# Patient Record
Sex: Male | Born: 1950 | Race: Black or African American | Hispanic: No | Marital: Married | Smoking: Former smoker
Health system: Southern US, Community
[De-identification: ages and names within clinical notes are randomized; demographics above are authoritative.]

## PROBLEM LIST (undated history)

## (undated) DIAGNOSIS — R51 Headache: Secondary | ICD-10-CM

## (undated) DIAGNOSIS — G4700 Insomnia, unspecified: Secondary | ICD-10-CM

## (undated) DIAGNOSIS — M1990 Unspecified osteoarthritis, unspecified site: Secondary | ICD-10-CM

## (undated) DIAGNOSIS — J45909 Unspecified asthma, uncomplicated: Secondary | ICD-10-CM

## (undated) DIAGNOSIS — E119 Type 2 diabetes mellitus without complications: Secondary | ICD-10-CM

## (undated) DIAGNOSIS — Z860100 Personal history of colon polyps, unspecified: Secondary | ICD-10-CM

## (undated) DIAGNOSIS — R011 Cardiac murmur, unspecified: Secondary | ICD-10-CM

## (undated) DIAGNOSIS — J189 Pneumonia, unspecified organism: Secondary | ICD-10-CM

## (undated) DIAGNOSIS — G4730 Sleep apnea, unspecified: Secondary | ICD-10-CM

## (undated) DIAGNOSIS — F32A Depression, unspecified: Secondary | ICD-10-CM

## (undated) DIAGNOSIS — M2540 Effusion, unspecified joint: Secondary | ICD-10-CM

## (undated) DIAGNOSIS — M2550 Pain in unspecified joint: Secondary | ICD-10-CM

## (undated) DIAGNOSIS — I10 Essential (primary) hypertension: Secondary | ICD-10-CM

## (undated) DIAGNOSIS — H409 Unspecified glaucoma: Secondary | ICD-10-CM

## (undated) DIAGNOSIS — K219 Gastro-esophageal reflux disease without esophagitis: Secondary | ICD-10-CM

## (undated) DIAGNOSIS — E785 Hyperlipidemia, unspecified: Secondary | ICD-10-CM

## (undated) DIAGNOSIS — M549 Dorsalgia, unspecified: Secondary | ICD-10-CM

## (undated) DIAGNOSIS — R350 Frequency of micturition: Secondary | ICD-10-CM

## (undated) DIAGNOSIS — F329 Major depressive disorder, single episode, unspecified: Secondary | ICD-10-CM

## (undated) HISTORY — PX: WISDOM TOOTH EXTRACTION: SHX21

## (undated) HISTORY — PX: COLONOSCOPY: SHX174

---

## 1970-10-31 HISTORY — PX: EYE SURGERY: SHX253

## 2014-04-18 ENCOUNTER — Ambulatory Visit: Payer: Self-pay | Admitting: Podiatry

## 2014-05-14 ENCOUNTER — Ambulatory Visit (INDEPENDENT_AMBULATORY_CARE_PROVIDER_SITE_OTHER): Payer: PRIVATE HEALTH INSURANCE | Admitting: Podiatry

## 2014-05-14 ENCOUNTER — Encounter: Payer: Self-pay | Admitting: Podiatry

## 2014-05-14 VITALS — BP 141/84 | HR 70 | Ht 69.5 in | Wt 249.0 lb

## 2014-05-14 DIAGNOSIS — B351 Tinea unguium: Secondary | ICD-10-CM | POA: Diagnosis not present

## 2014-05-14 DIAGNOSIS — M79609 Pain in unspecified limb: Secondary | ICD-10-CM | POA: Diagnosis not present

## 2014-05-14 DIAGNOSIS — M79606 Pain in leg, unspecified: Secondary | ICD-10-CM | POA: Insufficient documentation

## 2014-05-14 DIAGNOSIS — M216X9 Other acquired deformities of unspecified foot: Secondary | ICD-10-CM | POA: Insufficient documentation

## 2014-05-14 NOTE — Progress Notes (Signed)
Subjective: 63 year old male presents complaining of pain in arch of both feet.  Stated that both feet have occasional sharp pain with tingling sensations at about instep arch area of both feet x years, even before he was diagnosed with diabetes.  Blood sugar this morning was 97. Been diabetic x 4-5 years.   Review of Systems - General ROS: positive for  - sleep disturbance, weight gain and Takes pills and use machine to deal with sleep apnea.  Ophthalmic ROS: Using medication for Glaucoma. ENT ROS: Gets regular check up every 6 months for bronchopneumonia.  Respiratory ROS: no cough, shortness of breath, or wheezing Cardiovascular ROS: no chest pain or dyspnea on exertion Gastrointestinal ROS: Takes Magnesicum and problem with stool. Genito-Urinary ROS: no dysuria, trouble voiding, or hematuria Musculoskeletal ROS: Bad joint on both knees x many years. Bad joint on right >L. Neurological ROS: no TIA or stroke symptoms Dermatological ROS: negative.  Objective: Dermatologic: Hypertrophic dystrophic nails x 10.  Tropical rash comes and goes. He got it while he was in [employer] service.  Vascular: All pedal pulses are palpable. No edema or erythema noted.  Neurologic: All epicritic and tactile sensations grossly intact. Normal response to Monofilament sensory testing bilateral.  Subjective pain at plantar mid arch area with prolonged weight bearing. Orthopedic: Pain at both knee joint R>L.  Tight Achilles tendon bilateral.  Compensatory abduction and Subtalar Joint hyperpronation. No gross deformities in osseous structures.    Assessment: 1. Plantar fasciitis mid strip bilateral with neurologic menifestation. 2. Compensatory STJ hyperpronation bilateral with Tarsal tunnel syndrome. . 3. Ankle equinus bilateral secondary development due to severe knee joint problem.   Plan: Reviewed findings and available treatment options. Instructed how to do Achilles tendon stretch exercise.   Instructed to wear custom made Orthotic shoe inserts.  All nails debrided. Patient may return to [facility] to have Orthotics prepared. May return in 10 weeks for routine foot care.

## 2014-05-14 NOTE — Patient Instructions (Signed)
Seen for pain in arch of both feet and toe nails. Noted of tight Achilles tendon causing excess rotation of foot and possible Tarsal tunnel like neurologic symptom. All nails debrided. May benefit from Achilles tendon stretch exercise and Custom orthotics.  Return in 10 weeks for routine foot care.

## 2014-06-09 ENCOUNTER — Other Ambulatory Visit: Payer: Self-pay | Admitting: Orthopedic Surgery

## 2014-06-12 ENCOUNTER — Encounter (HOSPITAL_COMMUNITY)
Admission: RE | Admit: 2014-06-12 | Discharge: 2014-06-12 | Disposition: A | Discharge status: Home / Self Care | Payer: 59 | Source: Ambulatory Visit | Attending: Orthopedic Surgery | Admitting: Orthopedic Surgery

## 2014-06-12 ENCOUNTER — Encounter (HOSPITAL_COMMUNITY): Payer: Self-pay

## 2014-06-12 DIAGNOSIS — Z01810 Encounter for preprocedural cardiovascular examination: Secondary | ICD-10-CM | POA: Insufficient documentation

## 2014-06-12 DIAGNOSIS — I498 Other specified cardiac arrhythmias: Secondary | ICD-10-CM | POA: Insufficient documentation

## 2014-06-12 DIAGNOSIS — Z01818 Encounter for other preprocedural examination: Secondary | ICD-10-CM | POA: Insufficient documentation

## 2014-06-12 DIAGNOSIS — Z01812 Encounter for preprocedural laboratory examination: Secondary | ICD-10-CM | POA: Diagnosis present

## 2014-06-12 HISTORY — DX: Unspecified asthma, uncomplicated: J45.909

## 2014-06-12 HISTORY — DX: Essential (primary) hypertension: I10

## 2014-06-12 HISTORY — DX: Unspecified osteoarthritis, unspecified site: M19.90

## 2014-06-12 HISTORY — DX: Personal history of colon polyps, unspecified: Z86.0100

## 2014-06-12 HISTORY — DX: Type 2 diabetes mellitus without complications: E11.9

## 2014-06-12 HISTORY — DX: Pain in unspecified joint: M25.50

## 2014-06-12 HISTORY — DX: Pneumonia, unspecified organism: J18.9

## 2014-06-12 HISTORY — DX: Dorsalgia, unspecified: M54.9

## 2014-06-12 HISTORY — DX: Hyperlipidemia, unspecified: E78.5

## 2014-06-12 HISTORY — DX: Depression, unspecified: F32.A

## 2014-06-12 HISTORY — DX: Major depressive disorder, single episode, unspecified: F32.9

## 2014-06-12 HISTORY — DX: Headache: R51

## 2014-06-12 HISTORY — DX: Unspecified glaucoma: H40.9

## 2014-06-12 HISTORY — DX: Insomnia, unspecified: G47.00

## 2014-06-12 HISTORY — DX: Personal history of colonic polyps: Z86.010

## 2014-06-12 HISTORY — DX: Effusion, unspecified joint: M25.40

## 2014-06-12 HISTORY — DX: Frequency of micturition: R35.0

## 2014-06-12 LAB — URINE MICROSCOPIC-ADD ON

## 2014-06-12 LAB — ABO/RH: ABO/RH(D): O POS

## 2014-06-12 LAB — BASIC METABOLIC PANEL
Anion gap: 13 (ref 5–15)
BUN: 20 mg/dL (ref 6–23)
CHLORIDE: 100 meq/L (ref 96–112)
CO2: 26 mEq/L (ref 19–32)
CREATININE: 1.6 mg/dL — AB (ref 0.50–1.35)
Calcium: 9.3 mg/dL (ref 8.4–10.5)
GFR calc non Af Amer: 44 mL/min — ABNORMAL LOW (ref >90)
GFR, EST AFRICAN AMERICAN: 51 mL/min — AB (ref >90)
Glucose, Bld: 163 mg/dL — ABNORMAL HIGH (ref 70–99)
POTASSIUM: 4.3 meq/L (ref 3.7–5.3)
SODIUM: 139 meq/L (ref 137–147)

## 2014-06-12 LAB — CBC WITH DIFFERENTIAL/PLATELET
BASOS ABS: 0 10*3/uL (ref 0.0–0.1)
BASOS PCT: 0 % (ref 0–1)
Eosinophils Absolute: 0.1 10*3/uL (ref 0.0–0.7)
Eosinophils Relative: 1 % (ref 0–5)
HCT: 41 % (ref 39.0–52.0)
HEMOGLOBIN: 13.3 g/dL (ref 13.0–17.0)
Lymphocytes Relative: 18 % (ref 12–46)
Lymphs Abs: 1.3 10*3/uL (ref 0.7–4.0)
MCH: 28.2 pg (ref 26.0–34.0)
MCHC: 32.4 g/dL (ref 30.0–36.0)
MCV: 87 fL (ref 78.0–100.0)
Monocytes Absolute: 0.6 10*3/uL (ref 0.1–1.0)
Monocytes Relative: 8 % (ref 3–12)
NEUTROS ABS: 5.2 10*3/uL (ref 1.7–7.7)
NEUTROS PCT: 73 % (ref 43–77)
Platelets: 203 10*3/uL (ref 150–400)
RBC: 4.71 MIL/uL (ref 4.22–5.81)
RDW: 13.9 % (ref 11.5–15.5)
WBC: 7.1 10*3/uL (ref 4.0–10.5)

## 2014-06-12 LAB — PROTIME-INR
INR: 1.04 (ref 0.00–1.49)
Prothrombin Time: 13.6 seconds (ref 11.6–15.2)

## 2014-06-12 LAB — URINALYSIS, ROUTINE W REFLEX MICROSCOPIC
Bilirubin Urine: NEGATIVE
GLUCOSE, UA: NEGATIVE mg/dL
HGB URINE DIPSTICK: NEGATIVE
KETONES UR: NEGATIVE mg/dL
Leukocytes, UA: NEGATIVE
Nitrite: NEGATIVE
PROTEIN: 30 mg/dL — AB
Specific Gravity, Urine: 1.021 (ref 1.005–1.030)
Urobilinogen, UA: 0.2 mg/dL (ref 0.0–1.0)
pH: 7 (ref 5.0–8.0)

## 2014-06-12 LAB — SURGICAL PCR SCREEN
MRSA, PCR: NEGATIVE
Staphylococcus aureus: NEGATIVE

## 2014-06-12 LAB — TYPE AND SCREEN
ABO/RH(D): O POS
Antibody Screen: NEGATIVE

## 2014-06-12 LAB — APTT: APTT: 33 s (ref 24–37)

## 2014-06-12 MED ORDER — CHLORHEXIDINE GLUCONATE 4 % EX LIQD: 60.0000 mL | Freq: Once | CUTANEOUS | Status: DC

## 2014-06-12 NOTE — Progress Notes (Signed)
Sleep study to be requested [facility]

## 2014-06-12 NOTE — Progress Notes (Signed)
Average fasting blood sugar runs 90-110

## 2014-06-12 NOTE — Progress Notes (Signed)
Pt doesn't have a cardiologist  Denies ever having an echo/stress test/heart cath  [facility] [place] [facility]Christopher Jules HusbandsDyer  Denies EKG or CXR in past yr

## 2014-06-12 NOTE — Pre-Procedure Instructions (Signed)
Almond LintJames Halley Jr.  06/12/2014   Your procedure is scheduled on:  Fri, Aug 21 @ 12:15 PM  [facility]  at 10:15 AM.  Call this number if you have problems the morning of surgery: [phone]   Remember:   Do not eat food or drink liquids after midnight.   Take these medicines the morning of surgery with A SIP OF WATER: Sertraline(Zoloft) and Eye Drops                Stop taking your Aspirin. No Goody's,BC's,Aleve,Ibuprofen,Fish Oil,or any Herbal Medications   Do not wear jewelry  Do not wear lotions, powders, or colognes. You may wear deodorant.  Men may shave face and neck.  Do not bring valuables to the hospital.  [facility] is not responsible                  for any belongings or valuables.               Contacts, dentures or bridgework may not be worn into surgery.  Leave suitcase in the car. After surgery it may be brought to your room.  For patients admitted to the hospital, discharge time is determined by your                treatment team.                Special Instructions:  [facility] - Preparing for Surgery  Before surgery, you can play an important role.  Because skin is not sterile, your skin needs to be as free of germs as possible.  You can reduce the number of germs on you skin by washing with CHG (chlorahexidine gluconate) soap before surgery.  CHG is an antiseptic cleaner which kills germs and bonds with the skin to continue killing germs even after washing.  Please DO NOT use if you have an allergy to CHG or antibacterial soaps.  If your skin becomes reddened/irritated stop using the CHG and inform your nurse when you arrive at Short Stay.  Do not shave (including legs and underarms) for at least 48 hours prior to the first CHG shower.  You may shave your face.  Please follow these instructions carefully:   1.  Shower with CHG Soap the night before surgery and the                                morning of Surgery.  2.  If you choose to wash  your hair, wash your hair first as usual with your       normal shampoo.  3.  After you shampoo, rinse your hair and body thoroughly to remove the                      Shampoo.  4.  Use CHG as you would any other liquid soap.  You can apply chg directly       to the skin and wash gently with scrungie or a clean washcloth.  5.  Apply the CHG Soap to your body ONLY FROM THE NECK DOWN.        Do not use on open wounds or open sores.  Avoid contact with your eyes,       ears, mouth and genitals (private parts).  Wash genitals (private parts)       with your normal soap.  6.  Wash thoroughly, paying special attention to the area where your surgery        will be performed.  7.  Thoroughly rinse your body with warm water from the neck down.  8.  DO NOT shower/wash with your normal soap after using and rinsing off       the CHG Soap.  9.  Pat yourself dry with a clean towel.            10.  Wear clean pajamas.            11.  Place clean sheets on your bed the night of your first shower and do not        sleep with pets.  Day of Surgery  Do not apply any lotions/deoderants the morning of surgery.  Please wear clean clothes to the hospital/surgery center.     Please read over the following fact sheets that you were given: Pain Booklet, Coughing and Deep Breathing, Blood Transfusion Information, MRSA Information and Surgical Site Infection Prevention

## 2014-06-16 NOTE — Progress Notes (Signed)
Request for sleep study faxed to the [facility] [place].

## 2014-06-18 NOTE — H&P (Signed)
TOTAL KNEE ADMISSION H&P  Patient is being admitted for right total knee arthroplasty.  Subjective:  Chief Complaint:right knee pain.  HPI: George Pasillas JrAlmond Lint., 63 y.o. male, has a history of pain and functional disability in the right knee due to arthritis and has failed non-surgical conservative treatments for greater than 12 weeks to includeNSAID's and/or analgesics, corticosteriod injections, viscosupplementation injections, weight reduction as appropriate and activity modification.  Onset of symptoms was gradual, starting several years ago with gradually worsening course since that time. The patient noted no past surgery on the right knee(s).  Patient currently rates pain in the right knee(s) at 10 out of 10 with activity. Patient has night pain, worsening of pain with activity and weight bearing, pain that interferes with activities of daily living, crepitus and joint swelling.  Patient has evidence of joint space narrowing by imaging studies.  There is no active infection.  Patient Active Problem List   Diagnosis Date Noted  . Onychomycosis 05/14/2014  . Pain in lower limb 05/14/2014  . Equinus deformity of foot, acquired 05/14/2014   Past Medical History  Diagnosis Date  . Hyperlipidemia     takes Atorvastatin daily  . Diabetes mellitus without complication     takes Glipidide,Metformin,and Lantus daily  . Depression     takes Zoloft daily  . Insomnia     takes Trazodone nightly  . Pneumonia 40+yrs ago  . Asthma     as needed inhaler  . Headache(784.0)     last migraine on 06/11/14-takes Vicodin as needed  . Arthritis   . Joint pain   . Joint swelling   . Back pain     reason unknown  . History of colon polyps   . Urinary frequency   . Hypertension     takes Losartan and Verapamil daily as well as SPironolactone and Chlorthalidone  . Glaucoma     borderline and uses eye drops    Past Surgical History  Procedure Laterality Date  . Eye surgery Left 1972  . Colonoscopy       No prescriptions prior to admission   No Known Allergies  History  Substance Use Topics  . Smoking status: Former Games developermoker  . Smokeless tobacco: Never Used     Comment: quit smoking in 1995  . Alcohol Use: Yes     Comment: beer monthly    No family history on file.   Review of Systems  Constitutional: Negative.   HENT: Negative.   Eyes: Negative.   Respiratory: Negative.   Cardiovascular: Negative.   Gastrointestinal: Negative.   Genitourinary: Negative.   Musculoskeletal: Positive for joint pain.  Skin: Negative.   Neurological: Negative.   Endo/Heme/Allergies: Negative.   Psychiatric/Behavioral: Negative.     Objective:  Physical Exam  Constitutional: He is oriented to person, place, and time. He appears well-developed and well-nourished.  HENT:  Head: Normocephalic and atraumatic.  Eyes: Pupils are equal, round, and reactive to light.  Neck: Normal range of motion. Neck supple.  Cardiovascular: Intact distal pulses.   Respiratory: Effort normal.  Musculoskeletal: He exhibits tenderness.  The right knee is tender along the medial and peripatellar joint lines.  This is consistent with prior x-rays of shown bone-on-bone arthritis to the patellofemoral joint and severe cartilage loss to the medial side of the right and left knee with early subluxation of tibia beneath the femur.  His skin is intact.  He does have some diminished sensation to his feet secondary to his diabetes.  Neurological:  He is alert and oriented to person, place, and time.  Skin: Skin is warm and dry.  Psychiatric: He has a normal mood and affect. His behavior is normal. Judgment and thought content normal.    Vital signs in last 24 hours:    Labs:   Estimated body mass index is 36.26 kg/(m^2) as calculated from the following:   Height as of 05/14/14: 5' 9.5" (1.765 m).   Weight as of 05/14/14: 112.946 kg (249 lb).   Imaging Review X-rays included; standing AP, Rosenberg, lateral and  sunrise x-rays of the right knee show loss of articular cartilage 50% of the medial compartment of both knees, near bone-on-bone arthritis of the patellofemoral joint on the right sunrise view.  Assessment/Plan:  End stage arthritis, right knee   The patient history, physical examination, clinical judgment of the provider and imaging studies are consistent with end stage degenerative joint disease of the right knee(s) and total knee arthroplasty is deemed medically necessary. The treatment options including medical management, injection therapy arthroscopy and arthroplasty were discussed at length. The risks and benefits of total knee arthroplasty were presented and reviewed. The risks due to aseptic loosening, infection, stiffness, patella tracking problems, thromboembolic complications and other imponderables were discussed. The patient acknowledged the explanation, agreed to proceed with the plan and consent was signed. Patient is being admitted for inpatient treatment for surgery, pain control, PT, OT, prophylactic antibiotics, VTE prophylaxis, progressive ambulation and ADL's and discharge planning. The patient is planning to be discharged home with home health services

## 2014-06-19 MED ORDER — CEFAZOLIN SODIUM-DEXTROSE 2-3 GM-% IV SOLR
2.0000 g | INTRAVENOUS | Status: AC
  Administered 2014-06-20: 2 g via INTRAVENOUS
  Filled 2014-06-19: qty 50

## 2014-06-20 ENCOUNTER — Encounter (HOSPITAL_COMMUNITY): Payer: 59 | Admitting: Anesthesiology

## 2014-06-20 ENCOUNTER — Inpatient Hospital Stay (HOSPITAL_COMMUNITY)
Admission: RE | Admit: 2014-06-20 | Discharge: 2014-06-24 | DRG: 470 | Disposition: A | Discharge status: Skilled Nursing Facility | Payer: 59 | Source: Ambulatory Visit | Attending: Orthopedic Surgery | Admitting: Orthopedic Surgery

## 2014-06-20 ENCOUNTER — Encounter (HOSPITAL_COMMUNITY)
Admission: RE | Disposition: A | Discharge status: Skilled Nursing Facility | Payer: Self-pay | Source: Ambulatory Visit | Attending: Orthopedic Surgery

## 2014-06-20 ENCOUNTER — Encounter (HOSPITAL_COMMUNITY): Payer: Self-pay | Admitting: Anesthesiology

## 2014-06-20 ENCOUNTER — Inpatient Hospital Stay (HOSPITAL_COMMUNITY): Payer: 59 | Admitting: Anesthesiology

## 2014-06-20 DIAGNOSIS — G4700 Insomnia, unspecified: Secondary | ICD-10-CM | POA: Diagnosis present

## 2014-06-20 DIAGNOSIS — E119 Type 2 diabetes mellitus without complications: Secondary | ICD-10-CM | POA: Diagnosis present

## 2014-06-20 DIAGNOSIS — M25569 Pain in unspecified knee: Secondary | ICD-10-CM | POA: Diagnosis present

## 2014-06-20 DIAGNOSIS — H409 Unspecified glaucoma: Secondary | ICD-10-CM | POA: Diagnosis present

## 2014-06-20 DIAGNOSIS — D62 Acute posthemorrhagic anemia: Secondary | ICD-10-CM | POA: Diagnosis not present

## 2014-06-20 DIAGNOSIS — M1710 Unilateral primary osteoarthritis, unspecified knee: Principal | ICD-10-CM | POA: Diagnosis present

## 2014-06-20 DIAGNOSIS — Z860100 Personal history of colon polyps, unspecified: Secondary | ICD-10-CM

## 2014-06-20 DIAGNOSIS — G8918 Other acute postprocedural pain: Secondary | ICD-10-CM | POA: Diagnosis not present

## 2014-06-20 DIAGNOSIS — Z87891 Personal history of nicotine dependence: Secondary | ICD-10-CM

## 2014-06-20 DIAGNOSIS — Z794 Long term (current) use of insulin: Secondary | ICD-10-CM | POA: Diagnosis not present

## 2014-06-20 DIAGNOSIS — Z79899 Other long term (current) drug therapy: Secondary | ICD-10-CM

## 2014-06-20 DIAGNOSIS — E785 Hyperlipidemia, unspecified: Secondary | ICD-10-CM | POA: Diagnosis present

## 2014-06-20 DIAGNOSIS — M1711 Unilateral primary osteoarthritis, right knee: Secondary | ICD-10-CM

## 2014-06-20 DIAGNOSIS — J45909 Unspecified asthma, uncomplicated: Secondary | ICD-10-CM | POA: Diagnosis present

## 2014-06-20 DIAGNOSIS — F3289 Other specified depressive episodes: Secondary | ICD-10-CM | POA: Diagnosis present

## 2014-06-20 DIAGNOSIS — I10 Essential (primary) hypertension: Secondary | ICD-10-CM | POA: Diagnosis present

## 2014-06-20 DIAGNOSIS — F329 Major depressive disorder, single episode, unspecified: Secondary | ICD-10-CM | POA: Diagnosis present

## 2014-06-20 DIAGNOSIS — Z6836 Body mass index (BMI) 36.0-36.9, adult: Secondary | ICD-10-CM

## 2014-06-20 DIAGNOSIS — Z7982 Long term (current) use of aspirin: Secondary | ICD-10-CM | POA: Diagnosis not present

## 2014-06-20 HISTORY — PX: TOTAL KNEE ARTHROPLASTY: SHX125

## 2014-06-20 LAB — GLUCOSE, CAPILLARY
GLUCOSE-CAPILLARY: 96 mg/dL (ref 70–99)
GLUCOSE-CAPILLARY: 82 mg/dL (ref 70–99)
Glucose-Capillary: 61 mg/dL — ABNORMAL LOW (ref 70–99)
Glucose-Capillary: 128 mg/dL — ABNORMAL HIGH (ref 70–99)

## 2014-06-20 SURGERY — ARTHROPLASTY, KNEE, TOTAL
Anesthesia: Spinal | Site: Knee | Laterality: Right

## 2014-06-20 MED ORDER — OXYCODONE HCL 5 MG/5ML PO SOLN: 5.0000 mg | Freq: Once | ORAL | Status: DC | PRN

## 2014-06-20 MED ORDER — SPIRONOLACTONE 25 MG PO TABS
25.0000 mg | ORAL_TABLET | Freq: Every day | ORAL | Status: DC
  Administered 2014-06-20 – 2014-06-24 (×5): 25 mg via ORAL
  Filled 2014-06-20 (×5): qty 1

## 2014-06-20 MED ORDER — ACETAMINOPHEN 325 MG PO TABS: 650.0000 mg | ORAL_TABLET | Freq: Four times a day (QID) | ORAL | Status: DC | PRN

## 2014-06-20 MED ORDER — LOSARTAN POTASSIUM 50 MG PO TABS
100.0000 mg | ORAL_TABLET | Freq: Every day | ORAL | Status: DC
  Administered 2014-06-20 – 2014-06-24 (×5): 100 mg via ORAL
  Filled 2014-06-20 (×5): qty 2

## 2014-06-20 MED ORDER — CHLORTHALIDONE 25 MG PO TABS
12.5000 mg | ORAL_TABLET | Freq: Every day | ORAL | Status: DC
  Administered 2014-06-21 – 2014-06-24 (×4): 12.5 mg via ORAL
  Filled 2014-06-20 (×5): qty 0.5

## 2014-06-20 MED ORDER — BUPIVACAINE LIPOSOME 1.3 % IJ SUSP
20.0000 mL | INTRAMUSCULAR | Status: DC
  Filled 2014-06-20: qty 20

## 2014-06-20 MED ORDER — PROPOFOL 10 MG/ML IV BOLUS
INTRAVENOUS | Status: DC | PRN
  Administered 2014-06-20: 10 mg via INTRAVENOUS
  Administered 2014-06-20: 20 mg via INTRAVENOUS
  Administered 2014-06-20: 10 mg via INTRAVENOUS
  Administered 2014-06-20 (×2): 20 mg via INTRAVENOUS

## 2014-06-20 MED ORDER — OXYCODONE-ACETAMINOPHEN 5-325 MG PO TABS: 1.0000 | ORAL_TABLET | ORAL | Status: DC | PRN

## 2014-06-20 MED ORDER — OXYCODONE HCL 5 MG PO TABS
5.0000 mg | ORAL_TABLET | ORAL | Status: DC | PRN
  Administered 2014-06-20 – 2014-06-23 (×14): 10 mg via ORAL
  Administered 2014-06-23 (×2): 5 mg via ORAL
  Administered 2014-06-23 – 2014-06-24 (×4): 10 mg via ORAL
  Filled 2014-06-20 (×15): qty 2
  Filled 2014-06-20: qty 1
  Filled 2014-06-20 (×5): qty 2

## 2014-06-20 MED ORDER — ASPIRIN EC 325 MG PO TBEC
325.0000 mg | DELAYED_RELEASE_TABLET | Freq: Every day | ORAL | Status: DC
  Administered 2014-06-21 – 2014-06-24 (×4): 325 mg via ORAL
  Filled 2014-06-20 (×5): qty 1

## 2014-06-20 MED ORDER — KCL IN DEXTROSE-NACL 20-5-0.45 MEQ/L-%-% IV SOLN
INTRAVENOUS | Status: DC
  Administered 2014-06-20: 23:00:00 via INTRAVENOUS
  Administered 2014-06-20: 1000 mL via INTRAVENOUS
  Filled 2014-06-20 (×14): qty 1000

## 2014-06-20 MED ORDER — MENTHOL 3 MG MT LOZG: 1.0000 | LOZENGE | OROMUCOSAL | Status: DC | PRN

## 2014-06-20 MED ORDER — METHOCARBAMOL 500 MG PO TABS
500.0000 mg | ORAL_TABLET | Freq: Four times a day (QID) | ORAL | Status: DC | PRN
  Administered 2014-06-20 – 2014-06-23 (×7): 500 mg via ORAL
  Filled 2014-06-20 (×7): qty 1

## 2014-06-20 MED ORDER — METOCLOPRAMIDE HCL 10 MG PO TABS: 5.0000 mg | ORAL_TABLET | Freq: Three times a day (TID) | ORAL | Status: DC | PRN

## 2014-06-20 MED ORDER — PHENOL 1.4 % MT LIQD: 1.0000 | OROMUCOSAL | Status: DC | PRN

## 2014-06-20 MED ORDER — HYDROMORPHONE HCL PF 1 MG/ML IJ SOLN
INTRAMUSCULAR | Status: AC
  Filled 2014-06-20: qty 1

## 2014-06-20 MED ORDER — DOCUSATE SODIUM 100 MG PO CAPS
100.0000 mg | ORAL_CAPSULE | Freq: Two times a day (BID) | ORAL | Status: DC
  Administered 2014-06-20 – 2014-06-24 (×8): 100 mg via ORAL
  Filled 2014-06-20 (×9): qty 1

## 2014-06-20 MED ORDER — ONDANSETRON HCL 4 MG PO TABS: 4.0000 mg | ORAL_TABLET | Freq: Four times a day (QID) | ORAL | Status: DC | PRN

## 2014-06-20 MED ORDER — LATANOPROST 0.005 % OP SOLN
1.0000 [drp] | Freq: Every day | OPHTHALMIC | Status: DC
  Administered 2014-06-20 – 2014-06-23 (×4): 1 [drp] via OPHTHALMIC
  Filled 2014-06-20: qty 2.5

## 2014-06-20 MED ORDER — PROMETHAZINE HCL 25 MG/ML IJ SOLN
6.2500 mg | INTRAMUSCULAR | Status: DC | PRN
Start: 2014-06-20 — End: 2014-06-20

## 2014-06-20 MED ORDER — METHOCARBAMOL 500 MG PO TABS: 500.0000 mg | ORAL_TABLET | Freq: Two times a day (BID) | ORAL | Status: AC

## 2014-06-20 MED ORDER — HYDROMORPHONE HCL PF 1 MG/ML IJ SOLN
0.2500 mg | INTRAMUSCULAR | Status: DC | PRN
  Administered 2014-06-20 (×2): 0.5 mg via INTRAVENOUS

## 2014-06-20 MED ORDER — TRANEXAMIC ACID 100 MG/ML IV SOLN
1000.0000 mg | INTRAVENOUS | Status: DC | PRN
  Administered 2014-06-20: 1000 mg via INTRAVENOUS

## 2014-06-20 MED ORDER — SODIUM CHLORIDE 0.9 % IR SOLN
Status: DC | PRN
  Administered 2014-06-20: 3000 mL

## 2014-06-20 MED ORDER — FENTANYL CITRATE 0.05 MG/ML IJ SOLN
INTRAMUSCULAR | Status: DC | PRN
  Administered 2014-06-20 (×2): 50 ug via INTRAVENOUS

## 2014-06-20 MED ORDER — MIDAZOLAM HCL 2 MG/2ML IJ SOLN
INTRAMUSCULAR | Status: AC
  Filled 2014-06-20: qty 2

## 2014-06-20 MED ORDER — BUPIVACAINE LIPOSOME 1.3 % IJ SUSP
INTRAMUSCULAR | Status: DC | PRN
  Administered 2014-06-20: 20 mL

## 2014-06-20 MED ORDER — LACTATED RINGERS IV SOLN
INTRAVENOUS | Status: DC
  Administered 2014-06-20: 12:00:00 via INTRAVENOUS

## 2014-06-20 MED ORDER — ACETAMINOPHEN 650 MG RE SUPP: 650.0000 mg | Freq: Four times a day (QID) | RECTAL | Status: DC | PRN

## 2014-06-20 MED ORDER — INSULIN ASPART 100 UNIT/ML ~~LOC~~ SOLN
0.0000 [IU] | Freq: Three times a day (TID) | SUBCUTANEOUS | Status: DC
  Administered 2014-06-21 (×2): 5 [IU] via SUBCUTANEOUS
  Administered 2014-06-21: 3 [IU] via SUBCUTANEOUS
  Administered 2014-06-22: 2 [IU] via SUBCUTANEOUS
  Administered 2014-06-22: 3 [IU] via SUBCUTANEOUS
  Administered 2014-06-22 – 2014-06-23 (×2): 2 [IU] via SUBCUTANEOUS

## 2014-06-20 MED ORDER — METOCLOPRAMIDE HCL 5 MG/ML IJ SOLN: 5.0000 mg | Freq: Three times a day (TID) | INTRAMUSCULAR | Status: DC | PRN

## 2014-06-20 MED ORDER — CEFUROXIME SODIUM 1.5 G IJ SOLR
INTRAMUSCULAR | Status: AC
  Filled 2014-06-20: qty 1.5

## 2014-06-20 MED ORDER — MIDAZOLAM HCL 5 MG/5ML IJ SOLN
INTRAMUSCULAR | Status: DC | PRN
  Administered 2014-06-20 (×2): 1 mg via INTRAVENOUS

## 2014-06-20 MED ORDER — SENNOSIDES-DOCUSATE SODIUM 8.6-50 MG PO TABS: 1.0000 | ORAL_TABLET | Freq: Every evening | ORAL | Status: DC | PRN

## 2014-06-20 MED ORDER — FENTANYL CITRATE 0.05 MG/ML IJ SOLN
INTRAMUSCULAR | Status: AC
  Filled 2014-06-20: qty 5

## 2014-06-20 MED ORDER — BISACODYL 5 MG PO TBEC: 5.0000 mg | DELAYED_RELEASE_TABLET | Freq: Every day | ORAL | Status: DC | PRN

## 2014-06-20 MED ORDER — SODIUM CHLORIDE 0.9 % IR SOLN
Status: DC | PRN
  Administered 2014-06-20: 1000 mL

## 2014-06-20 MED ORDER — METHOCARBAMOL 1000 MG/10ML IJ SOLN
500.0000 mg | Freq: Four times a day (QID) | INTRAVENOUS | Status: DC | PRN
  Filled 2014-06-20: qty 5

## 2014-06-20 MED ORDER — ASPIRIN EC 325 MG PO TBEC: 325.0000 mg | DELAYED_RELEASE_TABLET | Freq: Two times a day (BID) | ORAL | Status: DC

## 2014-06-20 MED ORDER — METRONIDAZOLE 500 MG PO TABS
500.0000 mg | ORAL_TABLET | Freq: Three times a day (TID) | ORAL | Status: DC
  Administered 2014-06-20 – 2014-06-24 (×12): 500 mg via ORAL
  Filled 2014-06-20 (×14): qty 1

## 2014-06-20 MED ORDER — SODIUM CHLORIDE 0.9 % IJ SOLN
INTRAMUSCULAR | Status: DC | PRN
  Administered 2014-06-20: 40 mL

## 2014-06-20 MED ORDER — PROPOFOL 10 MG/ML IV BOLUS
INTRAVENOUS | Status: AC
  Filled 2014-06-20: qty 20

## 2014-06-20 MED ORDER — GLIPIZIDE 10 MG PO TABS
10.0000 mg | ORAL_TABLET | Freq: Two times a day (BID) | ORAL | Status: DC
  Administered 2014-06-21 – 2014-06-24 (×7): 10 mg via ORAL
  Filled 2014-06-20 (×10): qty 1

## 2014-06-20 MED ORDER — ALUM & MAG HYDROXIDE-SIMETH 200-200-20 MG/5ML PO SUSP: 30.0000 mL | ORAL | Status: DC | PRN

## 2014-06-20 MED ORDER — LACTATED RINGERS IV SOLN
INTRAVENOUS | Status: DC | PRN
  Administered 2014-06-20 (×2): via INTRAVENOUS

## 2014-06-20 MED ORDER — METFORMIN HCL 500 MG PO TABS: 1000.0000 mg | ORAL_TABLET | Freq: Two times a day (BID) | ORAL | Status: DC

## 2014-06-20 MED ORDER — TIMOLOL HEMIHYDRATE 0.5 % OP SOLN
1.0000 [drp] | Freq: Two times a day (BID) | OPHTHALMIC | Status: DC
  Administered 2014-06-20 – 2014-06-23 (×5): 1 [drp] via OPHTHALMIC
  Filled 2014-06-20: qty 10

## 2014-06-20 MED ORDER — TRAZODONE HCL 50 MG PO TABS
50.0000 mg | ORAL_TABLET | Freq: Every day | ORAL | Status: DC
  Administered 2014-06-20 – 2014-06-23 (×4): 50 mg via ORAL
  Filled 2014-06-20 (×5): qty 1

## 2014-06-20 MED ORDER — ONDANSETRON HCL 4 MG/2ML IJ SOLN: 4.0000 mg | Freq: Four times a day (QID) | INTRAMUSCULAR | Status: DC | PRN

## 2014-06-20 MED ORDER — VERAPAMIL HCL ER 240 MG PO TBCR
240.0000 mg | EXTENDED_RELEASE_TABLET | Freq: Every day | ORAL | Status: DC
  Administered 2014-06-20 – 2014-06-23 (×4): 240 mg via ORAL
  Filled 2014-06-20 (×5): qty 1

## 2014-06-20 MED ORDER — TRANEXAMIC ACID 100 MG/ML IV SOLN
1000.0000 mg | INTRAVENOUS | Status: DC
  Filled 2014-06-20: qty 10

## 2014-06-20 MED ORDER — HYDROMORPHONE HCL PF 1 MG/ML IJ SOLN
1.0000 mg | INTRAMUSCULAR | Status: DC | PRN
  Administered 2014-06-20: 1 mg via INTRAVENOUS
  Filled 2014-06-20: qty 1

## 2014-06-20 MED ORDER — INSULIN GLARGINE 100 UNIT/ML ~~LOC~~ SOLN
30.0000 [IU] | Freq: Every day | SUBCUTANEOUS | Status: DC
  Administered 2014-06-20 – 2014-06-24 (×5): 30 [IU] via SUBCUTANEOUS
  Filled 2014-06-20 (×5): qty 0.3

## 2014-06-20 MED ORDER — FLEET ENEMA 7-19 GM/118ML RE ENEM: 1.0000 | ENEMA | Freq: Once | RECTAL | Status: AC | PRN

## 2014-06-20 MED ORDER — CEFUROXIME SODIUM 1.5 G IJ SOLR
INTRAMUSCULAR | Status: DC | PRN
  Administered 2014-06-20: 1.5 g

## 2014-06-20 MED ORDER — BUPIVACAINE HCL (PF) 0.75 % IJ SOLN
INTRAMUSCULAR | Status: DC | PRN
  Administered 2014-06-20: 12 mg via INTRATHECAL

## 2014-06-20 MED ORDER — OXYCODONE HCL 5 MG PO TABS: 5.0000 mg | ORAL_TABLET | Freq: Once | ORAL | Status: DC | PRN

## 2014-06-20 MED ORDER — ONDANSETRON HCL 4 MG/2ML IJ SOLN
INTRAMUSCULAR | Status: DC | PRN
  Administered 2014-06-20: 4 mg via INTRAVENOUS

## 2014-06-20 MED ORDER — KCL IN DEXTROSE-NACL 20-5-0.45 MEQ/L-%-% IV SOLN
INTRAVENOUS | Status: AC
  Filled 2014-06-20: qty 1000

## 2014-06-20 MED ORDER — DIPHENHYDRAMINE HCL 12.5 MG/5ML PO ELIX: 12.5000 mg | ORAL_SOLUTION | ORAL | Status: DC | PRN

## 2014-06-20 MED ORDER — SERTRALINE HCL 100 MG PO TABS
100.0000 mg | ORAL_TABLET | Freq: Every day | ORAL | Status: DC
  Administered 2014-06-20 – 2014-06-24 (×5): 100 mg via ORAL
  Filled 2014-06-20 (×5): qty 1

## 2014-06-20 SURGICAL SUPPLY — 61 items
BANDAGE ELASTIC 6 VELCRO ST LF (GAUZE/BANDAGES/DRESSINGS) ×3 IMPLANT
BANDAGE ESMARK 6X9 LF (GAUZE/BANDAGES/DRESSINGS) ×1 IMPLANT
BLADE SAG 18X100X1.27 (BLADE) ×3 IMPLANT
BLADE SAW SGTL 13X75X1.27 (BLADE) ×3 IMPLANT
BLADE SURG 10 STRL SS (BLADE) ×3 IMPLANT
BLADE SURG ROTATE 9660 (MISCELLANEOUS) IMPLANT
BNDG ELASTIC 6X10 VLCR STRL LF (GAUZE/BANDAGES/DRESSINGS) ×3 IMPLANT
BNDG ESMARK 6X9 LF (GAUZE/BANDAGES/DRESSINGS) ×3
BOWL SMART MIX CTS (DISPOSABLE) ×3 IMPLANT
CAPT RP KNEE ×3 IMPLANT
CEMENT HV SMART SET (Cement) ×6 IMPLANT
COVER SURGICAL LIGHT HANDLE (MISCELLANEOUS) ×3 IMPLANT
CUFF TOURNIQUET SINGLE 34IN LL (TOURNIQUET CUFF) IMPLANT
CUFF TOURNIQUET SINGLE 44IN (TOURNIQUET CUFF) IMPLANT
DRAPE EXTREMITY T 121X128X90 (DRAPE) ×3 IMPLANT
DRAPE U-SHAPE 47X51 STRL (DRAPES) ×3 IMPLANT
DRSG PAD ABDOMINAL 8X10 ST (GAUZE/BANDAGES/DRESSINGS) ×3 IMPLANT
DURAPREP 26ML APPLICATOR (WOUND CARE) ×6 IMPLANT
ELECT REM PT RETURN 9FT ADLT (ELECTROSURGICAL) ×3
ELECTRODE REM PT RTRN 9FT ADLT (ELECTROSURGICAL) ×1 IMPLANT
EVACUATOR 1/8 PVC DRAIN (DRAIN) ×3 IMPLANT
GAUZE SPONGE 4X4 12PLY STRL (GAUZE/BANDAGES/DRESSINGS) ×6 IMPLANT
GAUZE XEROFORM 1X8 LF (GAUZE/BANDAGES/DRESSINGS) ×3 IMPLANT
GLOVE BIO SURGEON STRL SZ7.5 (GLOVE) ×3 IMPLANT
GLOVE BIO SURGEON STRL SZ8.5 (GLOVE) ×3 IMPLANT
GLOVE BIOGEL PI IND STRL 8 (GLOVE) ×1 IMPLANT
GLOVE BIOGEL PI IND STRL 9 (GLOVE) ×1 IMPLANT
GLOVE BIOGEL PI INDICATOR 8 (GLOVE) ×2
GLOVE BIOGEL PI INDICATOR 9 (GLOVE) ×2
GOWN STRL REUS W/ TWL LRG LVL3 (GOWN DISPOSABLE) ×1 IMPLANT
GOWN STRL REUS W/ TWL XL LVL3 (GOWN DISPOSABLE) ×2 IMPLANT
GOWN STRL REUS W/TWL LRG LVL3 (GOWN DISPOSABLE) ×2
GOWN STRL REUS W/TWL XL LVL3 (GOWN DISPOSABLE) ×4
HANDPIECE INTERPULSE COAX TIP (DISPOSABLE) ×2
HOOD PEEL AWAY FACE SHEILD DIS (HOOD) ×6 IMPLANT
KIT BASIN OR (CUSTOM PROCEDURE TRAY) ×3 IMPLANT
KIT ROOM TURNOVER OR (KITS) ×3 IMPLANT
MANIFOLD NEPTUNE II (INSTRUMENTS) ×3 IMPLANT
NDL SAFETY ECLIPSE 18X1.5 (NEEDLE) IMPLANT
NEEDLE 22X1 1/2 (OR ONLY) (NEEDLE) ×3 IMPLANT
NEEDLE HYPO 18GX1.5 SHARP (NEEDLE)
NEEDLE SPNL 18GX3.5 QUINCKE PK (NEEDLE) IMPLANT
NS IRRIG 1000ML POUR BTL (IV SOLUTION) ×3 IMPLANT
PACK TOTAL JOINT (CUSTOM PROCEDURE TRAY) ×3 IMPLANT
PAD ARMBOARD 7.5X6 YLW CONV (MISCELLANEOUS) ×6 IMPLANT
PADDING CAST COTTON 6X4 STRL (CAST SUPPLIES) ×3 IMPLANT
SET HNDPC FAN SPRY TIP SCT (DISPOSABLE) ×1 IMPLANT
SPONGE GAUZE 4X4 12PLY STER LF (GAUZE/BANDAGES/DRESSINGS) ×3 IMPLANT
STAPLER VISISTAT 35W (STAPLE) ×3 IMPLANT
SUCTION FRAZIER TIP 10 FR DISP (SUCTIONS) ×3 IMPLANT
SUT VIC AB 0 CTX 36 (SUTURE) ×2
SUT VIC AB 0 CTX36XBRD ANTBCTR (SUTURE) ×1 IMPLANT
SUT VIC AB 1 CTX 36 (SUTURE) ×2
SUT VIC AB 1 CTX36XBRD ANBCTR (SUTURE) ×1 IMPLANT
SUT VIC AB 2-0 CT1 27 (SUTURE) ×2
SUT VIC AB 2-0 CT1 TAPERPNT 27 (SUTURE) ×1 IMPLANT
SYR 30ML LL (SYRINGE) ×3 IMPLANT
SYR 50ML LL SCALE MARK (SYRINGE) ×3 IMPLANT
TOWEL OR 17X24 6PK STRL BLUE (TOWEL DISPOSABLE) ×3 IMPLANT
TOWEL OR 17X26 10 PK STRL BLUE (TOWEL DISPOSABLE) ×3 IMPLANT
WATER STERILE IRR 1000ML POUR (IV SOLUTION) ×6 IMPLANT

## 2014-06-20 NOTE — Progress Notes (Signed)
Orthopedic Tech Progress Note Patient Details:  George LintJames Brierley Jr. 1951-08-08 [id number]  CPM Right Knee CPM Right Knee: On Right Knee Flexion (Degrees): 60 Right Knee Extension (Degrees): 0 Additional Comments: traprzr bar patient helper Viewed order from doctor's order list  [name], [name] 06/20/2014, 9:30 PM

## 2014-06-20 NOTE — Op Note (Signed)
PATIENT ID:      George Monroe.  MRN:     [id number] DOB/AGE:    Jul 26, 1951 / 63 y.o.       OPERATIVE REPORT    DATE OF PROCEDURE:  06/20/2014       PREOPERATIVE DIAGNOSIS:   OA RIGHT KNEE      Estimated body mass index is 36.46 kg/(m^2) as calculated from the following:   Height as of 06/12/14: 5\' 9"  (1.753 m).   Weight as of this encounter: 112.038 kg (247 lb).                                                        POSTOPERATIVE DIAGNOSIS:   OA RIGHT KNEE                                                                      PROCEDURE:  Procedure(s): TOTAL KNEE ARTHROPLASTY Using Depuy Sigma RP implants #4R Femur, #4Tibia, 10mm Sigma RP bearing, 41 Patella     SURGEON: George Monroe    ASSISTANT:   George K. [employer] PA-C   (Present and scrubbed throughout the case, critical for assistance with exposure, retraction, instrumentation, and closure.)         ANESTHESIA: GET, ACB, Exparel  DRAINS: 2 medium hemovac in knee   TOURNIQUET TIME: 75min   COMPLICATIONS:  None     SPECIMENS: None   INDICATIONS FOR PROCEDURE: The patient has  OA RIGHT KNEE, varus deformities, XR shows bone on bone arthritis. Patient has failed all conservative measures including anti-inflammatory medicines, narcotics, attempts at  exercise and weight loss, cortisone injections and viscosupplementation.  Risks and benefits of surgery have been discussed, questions answered.   DESCRIPTION OF PROCEDURE: The patient identified by armband, received  IV antibiotics, in [facility] holding [facility]. Patient taken to the operating room, appropriate anesthetic  monitors were attached, and general endotracheal anesthesia induced with  the patient in supine position, Foley catheter was inserted. Tourniquet  applied high to the operative thigh. Lateral post and foot positioner  applied to the table, the lower extremity was then prepped and draped  in usual sterile fashion from the ankle to the tourniquet. Time-out  procedure was performed. The limb was wrapped with an Esmarch bandage and the tourniquet inflated to 350 mmHg. We began the operation by making the anterior midline incision starting at handbreadth above the patella going over the patella 1 cm medial to and  4 cm distal to the tibial tubercle. Small bleeders in the skin and the  subcutaneous tissue identified and cauterized. Transverse retinaculum was incised and reflected medially and a medial parapatellar arthrotomy was accomplished. the patella was everted and theprepatellar fat pad resected. The superficial medial collateral  ligament was then elevated from anterior to posterior along the proximal  flare of the tibia and anterior half of the menisci resected. The knee was hyperflexed exposing bone on bone arthritis. Peripheral and notch osteophytes as well as the cruciate ligaments were then resected. We continued to  work our way around posteriorly along  the proximal tibia, and externally  rotated the tibia subluxing it out from underneath the femur. A McHale  retractor was placed through the notch and a lateral Hohmann retractor  placed, and we then drilled through the proximal tibia in line with the  axis of the tibia followed by an intramedullary guide rod and 2-degree  posterior slope cutting guide. The tibial cutting guide was pinned into place  allowing resection of 7 mm of bone medially and about 12 mm of bone  laterally because of her varus deformity. Satisfied with the tibial resection, we then  entered the distal femur 2 mm anterior to the PCL origin with the  intramedullary guide rod and applied the distal femoral cutting guide  set at 11mm, with 5 degrees of valgus. This was pinned along the  epicondylar axis. At this point, the distal femoral cut was accomplished without difficulty. We then sized for a #4R femoral component and pinned the guide in 3 degrees of external rotation.The chamfer cutting guide was pinned into place. The  anterior, posterior, and chamfer cuts were accomplished without difficulty followed by  the box cutting guide and the box cut. We also removed posterior osteophytes from the posterior femoral condyles. At this  time, the knee was brought into full extension. We checked our  extension and flexion gaps and found them symmetric at 10mm.  The patella thickness measured at 25 mm. We set the cutting guide at 15 and removed the posterior 10 mm  of the patella, sized for a 41 button and drilled the lollipop. The knee  was then once again hyperflexed exposing the proximal tibia. We sized for a #4 tibial base plate, applied the smokestack and the conical reamer followed by the the Delta fin keel punch. We then hammered into place the Sigma RP trial femoral component, inserted a 10-mm trial bearing, trial patellar button, and took the knee through range of motion from 0-130 degrees. No thumb pressure was required for patellar  tracking. At this point, all trial components were removed, a double batch of DePuy HV cement with 1500 mg of Zinacef was mixed and applied to all bony metallic mating surfaces except for the posterior condyles of the femur itself. In order, we  hammered into place the tibial tray and removed excess cement, the femoral component and removed excess cement, a 10-mm Sigma RP bearing  was inserted, and the knee brought to full extension with compression.  The patellar button was clamped into place, and excess cement  removed. While the cement cured the wound was irrigated out with normal saline solution pulse lavage, and medium Hemovac drains were placed from an anterolateral  approach. Ligament stability and patellar tracking were checked and found to be excellent. The parapatellar arthrotomy was closed with  running #1 Vicryl suture. The subcutaneous tissue with 0 and 2-0 undyed  Vicryl suture, and the skin with skin staples. A dressing of Xeroform,  4 x 4, dressing sponges, Webril, and Ace  wrap applied. The patient  awakened, extubated, and taken to recovery room without difficulty.   George Monroe 06/20/2014, 1:15 PM

## 2014-06-20 NOTE — Interval H&P Note (Signed)
History and Physical Interval Note:  06/20/2014 11:05 AM  George LintJames Danker Jr.  has presented today for surgery, with the diagnosis of OA RIGHT KNEE  The various methods of treatment have been discussed with the patient and family. After consideration of risks, benefits and other options for treatment, the patient has consented to  Procedure(s): TOTAL KNEE ARTHROPLASTY (Right) as a surgical intervention .  The patient's history has been reviewed, patient examined, no change in status, stable for surgery.  I have reviewed the patient's chart and labs.  Questions were answered to the patient's satisfaction.     [name],[name]

## 2014-06-20 NOTE — Transfer of Care (Signed)
Immediate Anesthesia Transfer of Care Note  Patient: George LintJames Adames Jr.  Procedure(s) Performed: Procedure(s): TOTAL KNEE ARTHROPLASTY (Right)  Patient Location: PACU  Anesthesia Type:MAC and Spinal  Level of Consciousness: awake, alert  and oriented  Airway & Oxygen Therapy: Patient Spontanous Breathing and Patient connected to nasal cannula oxygen  Post-op Assessment: Report given to PACU RN and Post -op Vital signs reviewed and stable  Post vital signs: Reviewed and stable  Complications: No apparent anesthesia complications

## 2014-06-20 NOTE — Anesthesia Preprocedure Evaluation (Signed)
Anesthesia Evaluation  Patient identified by MRN, date of birth, ID band Patient awake    Reviewed: Allergy & Precautions, H&P , NPO status , Patient's Chart, lab work & pertinent test results  Airway Mallampati: II  Neck ROM: Full    Dental  (+) Teeth Intact   Pulmonary asthma , former smoker,  breath sounds clear to auscultation        Cardiovascular hypertension, Rhythm:Regular Rate:Normal     Neuro/Psych    GI/Hepatic   Endo/Other  diabetesMorbid obesity  Renal/GU      Musculoskeletal   Abdominal (+) + obese,   Peds  Hematology   Anesthesia Other Findings   Reproductive/Obstetrics                           Anesthesia Physical Anesthesia Plan  ASA: III  Anesthesia Plan: Spinal   Post-op Pain Management:    Induction:   Airway Management Planned: Natural Airway and Simple Face Mask  Additional Equipment:   Intra-op Plan:   Post-operative Plan:   Informed Consent: I have reviewed the patients History and Physical, chart, labs and discussed the procedure including the risks, benefits and alternatives for the proposed anesthesia with the patient or authorized representative who has indicated his/her understanding and acceptance.     Plan Discussed with: CRNA and Surgeon  Anesthesia Plan Comments:         Anesthesia Quick Evaluation

## 2014-06-20 NOTE — Anesthesia Postprocedure Evaluation (Signed)
  Anesthesia Post-op Note  Patient: George LintJames Scholze Jr.  Procedure(s) Performed: Procedure(s): TOTAL KNEE ARTHROPLASTY (Right)  Patient Location: PACU  Anesthesia Type:Regional  Level of Consciousness: awake, alert , oriented and patient cooperative  Airway and Oxygen Therapy: Patient Spontanous Breathing  Post-op Pain: mild  Post-op Assessment: Post-op Vital signs reviewed, Patient's Cardiovascular Status Stable, Respiratory Function Stable, Patent Airway and No signs of Nausea or vomiting  Post-op Vital Signs: stable  Last Vitals:  Filed Vitals:   06/20/14 1533  BP: 124/71  Pulse: 74  Temp: 37 C  Resp: 18    Complications: No apparent anesthesia complications

## 2014-06-20 NOTE — Anesthesia Procedure Notes (Addendum)
Spinal  Patient location during procedure: OR Start time: 06/20/2014 11:40 AM End time: 06/20/2014 11:51 AM Staffing Anesthesiologist: MASSAGEE, TERRY Performed by: anesthesiologist  Preanesthetic Checklist Completed: patient identified, site marked, surgical consent, pre-op evaluation, timeout performed, IV checked, risks and benefits discussed and monitors and equipment checked Spinal Block Patient position: sitting Patient monitoring: heart rate, cardiac monitor, continuous pulse ox and blood pressure Approach: right paramedian Location: L3-4 Injection technique: single-shot Needle Needle type: Tuohy  Needle gauge: 24 G Needle length: 9 cm Needle insertion depth: 9 cm Assessment Sensory level: T6 Additional Notes Tolerated well  Procedure Name: MAC Date/Time: 06/20/2014 11:40 AM Performed by: Delia ChimesVOSH, Jannine Abreu E Pre-anesthesia Checklist: Patient identified, Timeout performed, Emergency Drugs available, Suction available and Patient being monitored Patient Re-evaluated:Patient Re-evaluated prior to inductionOxygen Delivery Method: Nasal cannula

## 2014-06-20 NOTE — Progress Notes (Signed)
PHARMACIST - PHYSICIAN COMMUNICATION DR:  Turner Danielsowan CONCERNING:  METFORMIN SAFE ADMINISTRATION POLICY  RECOMMENDATION: Metformin has been placed on DISCONTINUE (rejected order) STATUS and should be reordered only after any of the conditions below are ruled out.  Current safety recommendations include avoiding metformin for a minimum of 48 hours after the patient's exposure to intravenous contrast media.  DESCRIPTION:  The Pharmacy Committee has adopted a policy that restricts the use of metformin in hospitalized patients until all the contraindications to administration have been ruled out. Specific contraindications are: [x]  Serum creatinine ? 1.5 for males []  Serum creatinine ? 1.4 for females []  Shock, acute MI, sepsis, hypoxemia, dehydration []  Planned administration of intravenous iodinated contrast media []  Heart Failure patients with low EF []  Acute or chronic metabolic acidosis (including DKA)     George Monroe, RPh Clinical Pharmacist Pager: [phone] 06/20/2014, 5:22PM

## 2014-06-21 LAB — CBC
HCT: 41.5 % (ref 39.0–52.0)
Hemoglobin: 12.9 g/dL — ABNORMAL LOW (ref 13.0–17.0)
MCH: 27.1 pg (ref 26.0–34.0)
MCHC: 31.1 g/dL (ref 30.0–36.0)
MCV: 87.2 fL (ref 78.0–100.0)
PLATELETS: 210 10*3/uL (ref 150–400)
RBC: 4.76 MIL/uL (ref 4.22–5.81)
RDW: 13.7 % (ref 11.5–15.5)
WBC: 11.7 10*3/uL — AB (ref 4.0–10.5)

## 2014-06-21 LAB — GLUCOSE, CAPILLARY
GLUCOSE-CAPILLARY: 213 mg/dL — AB (ref 70–99)
GLUCOSE-CAPILLARY: 204 mg/dL — AB (ref 70–99)
GLUCOSE-CAPILLARY: 159 mg/dL — AB (ref 70–99)
Glucose-Capillary: 242 mg/dL — ABNORMAL HIGH (ref 70–99)
Glucose-Capillary: 159 mg/dL — ABNORMAL HIGH (ref 70–99)

## 2014-06-21 LAB — HEMOGLOBIN A1C
Hgb A1c MFr Bld: 6.9 % — ABNORMAL HIGH (ref <5.7)
Mean Plasma Glucose: 151 mg/dL — ABNORMAL HIGH (ref <117)

## 2014-06-21 NOTE — Progress Notes (Signed)
Subjective: 1 Day Post-Op Procedure(s) (LRB): TOTAL KNEE ARTHROPLASTY (Right) Patient reports pain as moderate.  Taking by mouth and voiding okay. Positive flatus.  Objective: Vital signs in last 24 hours: Temp:  [98.6 F (37 C)-100.2 F (37.9 C)] 99.2 F (37.3 C) (08/22 0627) Pulse Rate:  [57-94] 82 (08/22 0627) Resp:  [8-20] 16 (08/22 0627) BP: (114-161)/(68-91) 148/78 mmHg (08/22 0627) SpO2:  [98 %-100 %] 98 % (08/22 0627) Weight:  [112.038 kg (247 lb)] 112.038 kg (247 lb) (08/21 1133)  Intake/Output from previous day: 08/21 0701 - 08/22 0700 In: 2418.8 [I.V.:2418.8] Out: 1000 [Urine:300; Drains:600; Blood:100] Intake/Output this shift:     Recent Labs  06/21/14 0640  HGB 12.9*    Recent Labs  06/21/14 0640  WBC 11.7*  RBC 4.76  HCT 41.5  PLT 210   No results found for this basename: NA, K, CL, CO2, BUN, CREATININE, GLUCOSE, CALCIUM,  in the last 72 hours No results found for this basename: LABPT, INR,  in the last 72 hours Right knee exam Neurovascular intact Sensation intact distally Intact pulses distally Dorsiflexion/Plantar flexion intact Incision: dressing C/D/I Compartment soft  Assessment/Plan: 1 Day Post-Op Procedure(s) (LRB): TOTAL KNEE ARTHROPLASTY (Right) Plan: Hemovac drains pulled. Aspirin and SCDs for DVT prophylaxis. Up with therapy Discharge to SNF Monday  Zayley Arras,Cleotis G 06/21/2014, 8:51 AM

## 2014-06-21 NOTE — Progress Notes (Signed)
OT Cancellation Note  Patient Details Name: George LintJames Lurie Jr. MRN: [id number] DOB: 1951/03/13   Cancelled Treatment:    Reason Eval/Treat Not Completed: OT screened, no needs identified, will sign off  Pt current D/C plan is SNF. No apparent immediate acute care OT needs, therefore will defer OT to SNF. If OT eval is needed please call Acute Rehab Dept. at [phone] or text page OT at [phone].    Earlie RavelingStraub, Rhiann Boucher L OTR/L [phone]   06/21/2014, 11:23 AM

## 2014-06-21 NOTE — Evaluation (Signed)
Physical Therapy Evaluation Patient Details Name: George Monroe. MRN: [id number] DOB: Dec 13, 1950 Today's Date: 06/21/2014   History of Present Illness  63 y.o. male s/p right total knee arthroplasty.  Clinical Impression  Pt is s/p right TKA presenting with the deficits listed below (see PT Problem List). Pt requires min assist with most mobility. Tolerating therapy well and motivated to improve. Pt will benefit from skilled PT to increase their independence and safety with mobility to allow discharge to the venue listed below.      Follow Up Recommendations SNF;Supervision for mobility/OOB    Equipment Recommendations  Rolling walker with 5" wheels;3in1 (PT)    Recommendations for Other Services       Precautions / Restrictions Precautions Precautions: Knee Precaution Comments: Reviewed knee precautions Restrictions Weight Bearing Restrictions: Yes RLE Weight Bearing: Weight bearing as tolerated      Mobility  Bed Mobility Overal bed mobility: Needs Assistance Bed Mobility: Supine to Sit     Supine to sit: HOB elevated;Min assist     General bed mobility comments: Min assist for RLE support off of bed. Able to scoot without assist.  Transfers Overall transfer level: Needs assistance Equipment used: Rolling walker (2 wheeled) Transfers: Sit to/from Stand Sit to Stand: Min assist         General transfer comment: Min assist for boost from lowest bed setting. Decreased WB through RLE initially. VC for hand placement and to increase RLE WB.  Ambulation/Gait Ambulation/Gait assistance: Min assist Ambulation Distance (Feet): 30 Feet Assistive device: Rolling walker (2 wheeled) Gait Pattern/deviations: Step-to pattern;Decreased step length - left;Decreased stance time - right;Decreased step length - right;Antalgic (poor heel strike RLE)   Gait velocity interpretation: Below normal speed for age/gender General Gait Details: Very slow and guarded gait. Educated on  safe DME use with rolling walker. Poor knee extension on Rt with no heel strike.Tactile cues for quad activation in stance phase. No buckling noted and lacks full knee extension at any point during gait cycle.  Stairs            Wheelchair Mobility    Modified Rankin (Stroke Patients Only)       Balance Overall balance assessment: Needs assistance Sitting-balance support: No upper extremity supported;Feet supported Sitting balance-Leahy Scale: Good     Standing balance support: No upper extremity supported;During functional activity Standing balance-Leahy Scale: Fair                               Pertinent Vitals/Pain Pain Assessment: 0-10 Pain Score: 5  Pain Location: right knee Pain Descriptors / Indicators: Sore Pain Intervention(s): Limited activity within patient's tolerance;Monitored during session;Premedicated before session;Repositioned    Home Living Family/patient expects to be discharged to:: Skilled nursing facility Living Arrangements: Spouse/significant other Available Help at Discharge: Family;Available 24 hours/day Type of Home: House Home Access: Stairs to enter Entrance Stairs-Rails: Doctor, general practice of Steps: 5 Home Layout: Two level;Bed/bath upstairs Home Equipment: None      Prior Function Level of Independence: Independent               Hand Dominance   Dominant Hand: Left    Extremity/Trunk Assessment   Upper Extremity Assessment: Defer to OT evaluation           Lower Extremity Assessment: RLE deficits/detail RLE Deficits / Details: Decreased strength and ROM as expected post op       Communication   Communication: No  difficulties  Cognition Arousal/Alertness: Lethargic;Suspect due to medications Behavior During Therapy: [facility] for tasks assessed/performed Overall Cognitive Status: Within Functional Limits for tasks assessed                      General Comments General comments  (skin integrity, edema, etc.): Moderate drainage from hemovac insertion site, post removal. Nurse aware.    Exercises Total Joint Exercises Ankle Circles/Pumps: AROM;Both;10 reps;Seated Quad Sets: AROM;Right;10 reps;Seated Long Arc Quad: AAROM;Right;5 reps;Seated      Assessment/Plan    PT Assessment Patient needs continued PT services  PT Diagnosis Difficulty walking;Abnormality of gait;Acute pain   PT Problem List Decreased strength;Decreased range of motion;Decreased balance;Decreased activity tolerance;Decreased mobility;Decreased knowledge of use of DME;Decreased knowledge of precautions;Impaired sensation;Pain  PT Treatment Interventions DME instruction;Gait training;Functional mobility training;Therapeutic activities;Therapeutic exercise;Balance training;Neuromuscular re-education;Cognitive remediation;Patient/family education;Modalities   PT Goals (Current goals can be found in the Care Plan section) Acute Rehab PT Goals Patient Stated Goal: Get well PT Goal Formulation: With patient Time For Goal Achievement: 06/28/14 Potential to Achieve Goals: Good    Frequency 7X/week   Barriers to discharge        Co-evaluation               End of Session   Activity Tolerance: Patient tolerated treatment well Patient left: in chair;with call bell/phone within reach;with family/visitor present Nurse Communication: Mobility status         Time: 2956-2130 PT Time Calculation (min): 35 min   Charges:   PT Evaluation $Initial PT Evaluation Tier I: 1 Procedure PT Treatments $Gait Training: 8-22 mins $Therapeutic Activity: 8-22 mins   PT G Codes:         Charlsie Merles, [place] [phone]  Berton Mount 06/21/2014, 11:34 AM

## 2014-06-22 ENCOUNTER — Encounter (HOSPITAL_COMMUNITY): Payer: Self-pay | Admitting: *Deleted

## 2014-06-22 LAB — CBC
HEMATOCRIT: 37.7 % — AB (ref 39.0–52.0)
Hemoglobin: 12.4 g/dL — ABNORMAL LOW (ref 13.0–17.0)
MCH: 27.7 pg (ref 26.0–34.0)
MCHC: 32.9 g/dL (ref 30.0–36.0)
MCV: 84.3 fL (ref 78.0–100.0)
Platelets: 206 10*3/uL (ref 150–400)
RBC: 4.47 MIL/uL (ref 4.22–5.81)
RDW: 13.6 % (ref 11.5–15.5)
WBC: 15.7 10*3/uL — ABNORMAL HIGH (ref 4.0–10.5)

## 2014-06-22 LAB — GLUCOSE, CAPILLARY
GLUCOSE-CAPILLARY: 131 mg/dL — AB (ref 70–99)
GLUCOSE-CAPILLARY: 200 mg/dL — AB (ref 70–99)
Glucose-Capillary: 131 mg/dL — ABNORMAL HIGH (ref 70–99)

## 2014-06-22 NOTE — Progress Notes (Signed)
Clinical Social Work Department CLINICAL SOCIAL WORK PLACEMENT NOTE 06/22/2014  Patient:  George Monroe, George Monroe  Account Number:  [id number] Admit date:  06/20/2014  Clinical Social Worker:  Jetta Lout, [place]  Date/time:  06/22/2014 07:00 PM  Clinical Social Work is seeking post-discharge placement for this patient at the following level of care:   SKILLED NURSING   (*CSW will update this form in Epic as items are completed)   06/22/2014  Patient/family provided with [facility] Department of Clinical Social Work's list of facilities offering this level of care within the geographic area requested by the patient (or if unable, by the patient's family).  06/22/2014  Patient/family informed of their freedom to choose among providers that offer the needed level of care, that participate in Medicare, Medicaid or managed care program needed by the patient, have an available bed and are willing to accept the patient.  06/22/2014  Patient/family informed of [facility]' ownership interest in [facility], as well as of the fact that they are under no obligation to receive care at this facility.  PASARR submitted to EDS on 06/22/2014 PASARR number received on 06/22/2014  FL2 transmitted to all facilities in geographic area requested by pt/family on  06/22/2014 FL2 transmitted to all facilities within larger geographic area on   Patient informed that his/her managed care company has contracts with or will negotiate with  certain facilities, including the following:     Patient/family informed of bed offers received:   Patient chooses bed at  Physician recommends and patient chooses bed at    Patient to be transferred to  on   Patient to be transferred to facility by  Patient and family notified of transfer on  Name of family member notified:    The following physician request were entered in Epic:   Additional Comments:

## 2014-06-22 NOTE — Progress Notes (Signed)
Patient's foam dressing and compression wrap changed per PA's verbal order.  Patient denies any questions or concerns at this time.  Will continue to monitor.

## 2014-06-22 NOTE — Progress Notes (Signed)
Subjective: 2 Days Post-Op Procedure(s) (LRB): TOTAL KNEE ARTHROPLASTY (Right) Patient reports pain as moderate.  Taking by mouth and voiding without difficulty. Positive flatus. Up with physical therapy yesterday.  Objective: Vital signs in last 24 hours: Temp:  [98.3 F (36.8 C)-99.6 F (37.6 C)] 99.6 F (37.6 C) (08/23 0520) Pulse Rate:  [67-89] 76 (08/23 0520) Resp:  [16-18] 18 (08/23 0520) BP: (141-146)/(74-87) 146/75 mmHg (08/23 0520) SpO2:  [98 %-99 %] 98 % (08/23 0520)  Intake/Output from previous day: 08/22 0701 - 08/23 0700 In: 480 [P.O.:480] Out: 600 [Urine:600] Intake/Output this shift:     Recent Labs  06/21/14 0640 06/22/14 0426  HGB 12.9* 12.4*    Recent Labs  06/21/14 0640 06/22/14 0426  WBC 11.7* 15.7*  RBC 4.76 4.47  HCT 41.5 37.7*  PLT 210 206   Right knee exam: Neurovascular intact Sensation intact distally Intact pulses distally Dorsiflexion/Plantar flexion intact Incision: dressing C/D/I Compartment soft  Assessment/Plan: 2 Days Post-Op Procedure(s) (LRB): TOTAL KNEE ARTHROPLASTY (Right) Plan: Dressing changed by nursing staff. Continue aspirin and SCDs for DVT prophylaxis. Up with therapy Discharge to SNF tomorrow  Irlanda Croghan,Tong G 06/22/2014, 10:50 AM

## 2014-06-22 NOTE — Progress Notes (Signed)
Clinical Social Work Department BRIEF PSYCHOSOCIAL ASSESSMENT 06/22/2014  Patient:  GEFFREY, MICHAELSEN     Account Number:  [id number]     Admit date:  06/20/2014  Clinical Social Worker:  Rolinda Roan  Date/Time:  06/22/2014 06:53 PM  Referred by:  Physician  Date Referred:  06/20/2014 Referred for  SNF Placement   Other Referral:   Interview type:  Patient Other interview type:    PSYCHOSOCIAL DATA Living Status:  WIFE Admitted from facility:   Level of care:   Primary support name:  Fernandez Kenley [phone] Primary support relationship to patient:  SPOUSE Degree of support available:   Strong support at bedside.    CURRENT CONCERNS  Other Concerns:    SOCIAL WORK ASSESSMENT / PLAN Clinical Social Worker (CSW) met with patient and his wife Hassan Rowan at bedside. Patient reported that he lives in [place] and registered at [facility]. CSW encouraged patient to think about a second SNF in case [facility] not have a bed. Patient reported that [facility] is his second choice.   Assessment/plan status:  Psychosocial Support/Ongoing Assessment of Needs Other assessment/ plan:   Information/referral to community resources:   CSW gave patient SNF list.    PATIENT'S/FAMILY'S RESPONSE TO PLAN OF CARE: Patient and wife thanked CSW for visit and assisting with placement process.

## 2014-06-22 NOTE — Progress Notes (Signed)
Physical Therapy Treatment Patient Details Name: George Monroe. MRN: [id number] DOB: Nov 20, 1950 Today's Date: 06/22/2014    History of Present Illness 63 y.o. male s/p right total knee arthroplasty.    PT Comments    Patient progressing towards physical therapy goals. Continues to required min assist with most mobility but increasing ambulatory distance and functional gait mechanics. Tolerating therapeutic exercises well. Patient will continue to benefit from skilled physical therapy services to further improve independence with functional mobility.   Follow Up Recommendations  SNF;Supervision for mobility/OOB     Equipment Recommendations  Rolling walker with 5" wheels;3in1 (PT)    Recommendations for Other Services       Precautions / Restrictions Precautions Precautions: Knee Precaution Comments: Reviewed knee precautions Restrictions Weight Bearing Restrictions: Yes RLE Weight Bearing: Weight bearing as tolerated    Mobility  Bed Mobility Overal bed mobility: Needs Assistance Bed Mobility: Supine to Sit     Supine to sit: HOB elevated;Min guard     General bed mobility comments: Min guard for safety. Requires extra time. Educated to use LLE to support RLE out of bed.  Transfers Overall transfer level: Needs assistance Equipment used: Rolling walker (2 wheeled) Transfers: Sit to/from Stand Sit to Stand: Min assist         General transfer comment: Min assist for boost to stand from lowest bed setting. Required two attempts. VC for hand placement.  Ambulation/Gait Ambulation/Gait assistance: Min assist Ambulation Distance (Feet): 75 Feet Assistive device: Rolling walker (2 wheeled) Gait Pattern/deviations: Step-to pattern;Step-through pattern;Decreased step length - left;Decreased stance time - right;Antalgic   Gait velocity interpretation: Below normal speed for age/gender General Gait Details: Continues to have slow and guarded gait. Focused on  step-through gait pattern. Frequent VC for right knee extension in stance phase for quad activation. Moderate instability of Rt knee but pt able to support himself on RW with  min assist from PT for RW control.   Stairs            Wheelchair Mobility    Modified Rankin (Stroke Patients Only)       Balance                                    Cognition Arousal/Alertness: Awake/alert Behavior During Therapy: WFL for tasks assessed/performed Overall Cognitive Status: Within Functional Limits for tasks assessed                      Exercises Total Joint Exercises Ankle Circles/Pumps: AROM;Both;10 reps;Seated Quad Sets: AROM;Right;10 reps;Seated Heel Slides: AAROM;Right;10 reps;Seated Long Arc Quad: AAROM;Right;Seated;10 reps Goniometric ROM: 10-76 degrees Rt knee flexion in sitting    General Comments        Pertinent Vitals/Pain Pain Assessment: 0-10 Pain Score: 5  Pain Location: Rt knee Pain Intervention(s): Limited activity within patient's tolerance;Monitored during session;Repositioned    Home Living Family/patient expects to be discharged to:: Skilled nursing facility Living Arrangements: Spouse/significant other                  Prior Function            PT Goals (current goals can now be found in the care plan section) Acute Rehab PT Goals Patient Stated Goal: Get well PT Goal Formulation: With patient Time For Goal Achievement: 06/28/14 Potential to Achieve Goals: Good Progress towards PT goals: Progressing toward goals    Frequency  7X/week  PT Plan Current plan remains appropriate    Co-evaluation             End of Session   Activity Tolerance: Patient tolerated treatment well Patient left: in chair;with call bell/phone within reach     Time: 1610-9604 PT Time Calculation (min): 33 min  Charges:  $Gait Training: 8-22 mins $Therapeutic Exercise: 8-22 mins $Therapeutic Activity: 8-22 mins                     G Codes:      [employer], [place] [phone]  Berton Mount 06/22/2014, 11:26 AM

## 2014-06-23 LAB — GLUCOSE, CAPILLARY
GLUCOSE-CAPILLARY: 132 mg/dL — AB (ref 70–99)
GLUCOSE-CAPILLARY: 211 mg/dL — AB (ref 70–99)
Glucose-Capillary: 139 mg/dL — ABNORMAL HIGH (ref 70–99)
Glucose-Capillary: 148 mg/dL — ABNORMAL HIGH (ref 70–99)

## 2014-06-23 LAB — CBC
HCT: 37.3 % — ABNORMAL LOW (ref 39.0–52.0)
Hemoglobin: 12.2 g/dL — ABNORMAL LOW (ref 13.0–17.0)
MCH: 27.2 pg (ref 26.0–34.0)
MCHC: 32.7 g/dL (ref 30.0–36.0)
MCV: 83.3 fL (ref 78.0–100.0)
PLATELETS: 214 10*3/uL (ref 150–400)
RBC: 4.48 MIL/uL (ref 4.22–5.81)
RDW: 13.4 % (ref 11.5–15.5)
WBC: 12.7 10*3/uL — ABNORMAL HIGH (ref 4.0–10.5)

## 2014-06-23 NOTE — Progress Notes (Signed)
Pt given bed offers and offers discussed with pt.  Unit CSW will f/u with pt in am re: his choice and facilitate NH tx.

## 2014-06-23 NOTE — Progress Notes (Signed)
CSW awaiting return call from [facility] admission coordinator re: pt's tx to the facility this pm.  CSW will continue to follow.

## 2014-06-23 NOTE — Progress Notes (Signed)
PATIENT ID: George Monroe.  MRN: [id number]  DOB/AGE:  September 03, 1951 / 63 y.o.  3 Days Post-Op Procedure(s) (LRB): TOTAL KNEE ARTHROPLASTY (Right)    PROGRESS NOTE Subjective: Patient is alert, oriented, no Nausea, no Vomiting, yes passing gas, yes Bowel Movement. Taking PO well. Denies SOB, Chest or Calf Pain. Using Incentive Spirometer, PAS in place. Ambulate WBAT, CPM 0-60 Patient reports pain as 5 on 0-10 scale  .    Objective: Vital signs in last 24 hours: Filed Vitals:   06/23/14 0609 06/23/14 0811 06/23/14 1051 06/23/14 1331  BP: 139/69 134/80 127/71 137/81  Pulse: 78 91 78 88  Temp: 99.3 F (37.4 C) 98.6 F (37 C) 98.7 F (37.1 C) 98.1 F (36.7 C)  TempSrc: Oral Oral Oral Oral  Resp: Height:      Weight:      SpO2: 98% 98% 96% 98%      Intake/Output from previous day: I/O last 3 completed shifts: In: 720 [P.O.:720] Out: 600 [Urine:600]   Intake/Output this shift: Total I/O In: 260 [P.O.:260] Out: -    LABORATORY DATA:  Recent Labs  06/22/14 0426  06/22/14 1626 06/23/14 0505 06/23/14 0805 06/23/14 1142  WBC 15.7*  --   --  12.7*  --   --   HGB 12.4*  --   --  12.2*  --   --   HCT 37.7*  --   --  37.3*  --   --   PLT 206  --   --  214  --   --   GLUCAP  --   < > 131*  --  132* 139*  < > = values in this interval not displayed.  Examination: Neurologically intact Neurovascular intact Sensation intact distally Intact pulses distally Dorsiflexion/Plantar flexion intact Incision: dressing C/D/I No cellulitis present Compartment soft}  Assessment:   3 Days Post-Op Procedure(s) (LRB): TOTAL KNEE ARTHROPLASTY (Right) ADDITIONAL DIAGNOSIS: Expected Acute Blood Loss Anemia, Diabetes  Plan: PT/OT WBAT, CPM 5/hrs day until ROM 0-90 degrees, then D/C CPM DVT Prophylaxis:  SCDx72hrs, ASA 325 mg BID x 2 weeks DISCHARGE PLAN: Skilled Nursing Facility/Rehab today DISCHARGE NEEDS: HHPT, HHRN, CPM, Walker and 3-in-1 comode seat     Oniel Meleski,  Zen Cedillos R 06/23/2014, 2:37 PM

## 2014-06-23 NOTE — Discharge Summary (Signed)
Patient ID: George Monroe. MRN: [id number] DOB/AGE: Mar 18, 1951 63 y.o.  Admit date: 06/20/2014 Discharge date: 06/23/2014  Admission Diagnoses:  Active Problems:   Arthritis of right knee   Discharge Diagnoses:  Same  Past Medical History  Diagnosis Date  . Hyperlipidemia     takes Atorvastatin daily  . Diabetes mellitus without complication     takes Glipidide,Metformin,and Lantus daily  . Depression     takes Zoloft daily  . Insomnia     takes Trazodone nightly  . Pneumonia 40+yrs ago  . Asthma     as needed inhaler  . Headache(784.0)     last migraine on 06/11/14-takes Vicodin as needed  . Arthritis   . Joint pain   . Joint swelling   . Back pain     reason unknown  . History of colon polyps   . Urinary frequency   . Hypertension     takes Losartan and Verapamil daily as well as SPironolactone and Chlorthalidone  . Glaucoma     borderline and uses eye drops    Surgeries: Procedure(s): TOTAL KNEE ARTHROPLASTY on 06/20/2014   Consultants:    Discharged Condition: Improved  Hospital Course: George Monroe. is an 63 y.o. male who was admitted 06/20/2014 for operative treatment of<principal problem not specified>. Patient has severe unremitting pain that affects sleep, daily activities, and work/hobbies. After pre-op clearance the patient was taken to the operating room on 06/20/2014 and underwent  Procedure(s): TOTAL KNEE ARTHROPLASTY.    Patient was given perioperative antibiotics: Anti-infectives   Start     Dose/Rate Route Frequency Ordered Stop   06/20/14 1700  metroNIDAZOLE (FLAGYL) tablet 500 mg     500 mg Oral 3 times daily 06/20/14 1647     06/20/14 1238  cefUROXime (ZINACEF) injection  Status:  Discontinued       As needed 06/20/14 1239 06/20/14 1341   06/20/14 0600  ceFAZolin (ANCEF) IVPB 2 g/50 mL premix     2 g 100 mL/hr over 30 Minutes Intravenous On call to O.R. 06/19/14 1424 06/20/14 1143       Patient was given sequential compression  devices, early ambulation, and chemoprophylaxis to prevent DVT.  Patient benefited maximally from hospital stay and there were no complications.    Recent vital signs: Patient Vitals for the past 24 hrs:  BP Temp Temp src Pulse Resp SpO2  06/23/14 1331 137/81 mmHg 98.1 F (36.7 C) Oral 88 - 98 %  06/23/14 1051 127/71 mmHg 98.7 F (37.1 C) Oral 78 18 96 %  06/23/14 0811 134/80 mmHg 98.6 F (37 C) Oral 91 16 98 %  06/23/14 0609 139/69 mmHg 99.3 F (37.4 C) Oral 78 18 98 %  06/22/14 2239 146/73 mmHg 99.1 F (37.3 C) Oral 92 16 100 %  06/22/14 1508 140/81 mmHg 98 F (36.7 C) Oral 86 18 99 %     Recent laboratory studies:  Recent Labs  06/22/14 0426 06/23/14 0505  WBC 15.7* 12.7*  HGB 12.4* 12.2*  HCT 37.7* 37.3*  PLT 206 214     Discharge Medications:     Medication List    STOP taking these medications       aspirin 81 MG tablet  Replaced by:  aspirin EC 325 MG tablet     HYDROcodone-acetaminophen 10-325 MG per tablet  Commonly known as:  NORCO      TAKE these medications       aspirin EC 325 MG tablet  Take 1 tablet (  325 mg total) by mouth 2 (two) times daily.     atorvastatin 40 MG tablet  Commonly known as:  LIPITOR  Take 20 mg by mouth daily.     betamethasone valerate 0.1 % cream  Commonly known as:  VALISONE  Apply topically 2 (two) times daily.     chlorthalidone 25 MG tablet  Commonly known as:  HYGROTON  Take 12.5 mg by mouth daily.     Cholecalciferol 2000 UNITS Tabs  Take 4,000 Units by mouth daily. Two tabs by mouth daily     cyanocobalamin 500 MCG tablet  Take 500 mcg by mouth daily.     diclofenac sodium 1 % Gel  Commonly known as:  VOLTAREN  Apply topically 2 (two) times daily.     glipiZIDE 10 MG tablet  Commonly known as:  GLUCOTROL  Take 10 mg by mouth 2 (two) times daily before a meal.     guaiFENesin 100 MG/5ML liquid  Commonly known as:  ROBITUSSIN  Take 200 mg by mouth 3 (three) times daily as needed for cough.      insulin glargine 100 UNIT/ML injection  Commonly known as:  LANTUS  Inject 30 Units into the skin daily.     latanoprost 0.005 % ophthalmic solution  Commonly known as:  XALATAN  Place 1 drop into both eyes at bedtime.     losartan 100 MG tablet  Commonly known as:  COZAAR  Take 100 mg by mouth daily.     magnesium oxide 400 MG tablet  Commonly known as:  MAG-OX  Take 1,680 mg by mouth 2 (two) times daily.     metFORMIN 500 MG tablet  Commonly known as:  GLUCOPHAGE  Take 1,000 mg by mouth 2 (two) times daily with a meal.     methocarbamol 500 MG tablet  Commonly known as:  ROBAXIN  Take 1 tablet (500 mg total) by mouth 2 (two) times daily with a meal.     metroNIDAZOLE 500 MG tablet  Commonly known as:  FLAGYL  Take 500 mg by mouth 3 (three) times daily.     NOVOFINE 30G X 8 MM Misc  Generic drug:  Insulin Pen Needle  Inject 1 packet into the skin as needed.     oxyCODONE-acetaminophen 5-325 MG per tablet  Commonly known as:  ROXICET  Take 1 tablet by mouth every 4 (four) hours as needed.     PRECISION XTRA TEST STRIPS test strip  Generic drug:  glucose blood  1 each by Other route as needed for other. Use as instructed     sertraline 100 MG tablet  Commonly known as:  ZOLOFT  Take 100 mg by mouth daily.     spironolactone 25 MG tablet  Commonly known as:  ALDACTONE  Take 25 mg by mouth daily.     tadalafil 20 MG tablet  Commonly known as:  CIALIS  Take 10 mg by mouth daily as needed for erectile dysfunction.     TECHLITE LANCETS Misc  by Does not apply route.     testosterone cypionate 200 MG/ML injection  Commonly known as:  DEPOTESTOTERONE CYPIONATE  Inject 200 mg into the muscle every 14 (fourteen) days.     timolol 0.5 % ophthalmic solution  Commonly known as:  BETIMOL  Place 1 drop into both eyes 2 (two) times daily.     traZODone 50 MG tablet  Commonly known as:  DESYREL  Take 50 mg by mouth at bedtime.  verapamil 240 MG CR tablet   Commonly known as:  CALAN-SR  Take 240 mg by mouth at bedtime.        Diagnostic Studies: Dg Chest 2 View  06/12/2014   CLINICAL DATA:  Preop, right knee replacement  EXAM: CHEST  2 VIEW  COMPARISON:  None.  FINDINGS: Cardiomediastinal silhouette is unremarkable. No acute infiltrate or pleural effusion. No pulmonary edema. Mild degenerative changes thoracic spine.  IMPRESSION: No active cardiopulmonary disease.   Electronically Signed   By: Natasha Mead M.D.   On: 06/12/2014 12:25    Disposition: Final discharge disposition not confirmed      Discharge Instructions   CPM    Complete by:  As directed   Continuous passive motion machine (CPM):      Use the CPM from 0 to 60  for 5 hours per day.      You may increase by 10 degrees per day.  You may break it up into 2 or 3 sessions per day.      Use CPM for 2 weeks or until you are told to stop.     Call MD / Call 911    Complete by:  As directed   If you experience chest pain or shortness of breath, CALL 911 and be transported to the hospital emergency room.  If you develope a fever above 101 F, pus (white drainage) or increased drainage or redness at the wound, or calf pain, call your surgeon's office.     Change dressing    Complete by:  As directed   Change dressing on 5, then change the dressing daily with sterile 4 x 4 inch gauze dressing and apply TED hose.  You may clean the incision with alcohol prior to redressing.     Constipation Prevention    Complete by:  As directed   Drink plenty of fluids.  Prune juice may be helpful.  You may use a stool softener, such as Colace (over the counter) 100 mg twice a day.  Use MiraLax (over the counter) for constipation as needed.     Diet - low sodium heart healthy    Complete by:  As directed      Discharge instructions    Complete by:  As directed   Follow up in office with Dr. Turner Daniels in 2 weeks.     Driving restrictions    Complete by:  As directed   No driving for 2 weeks      Increase activity slowly as tolerated    Complete by:  As directed      Patient may shower    Complete by:  As directed   You may shower without a dressing once there is no drainage.  Do not wash over the wound.  If drainage remains, cover wound with plastic wrap and then shower.           Follow-up Information   Follow up with Nestor Lewandowsky, MD In 2 weeks.   Specialty:  Orthopedic Surgery   Contact information:   [address] [place] [address] [phone]        Signed: Henry Russel 06/23/2014, 2:40 PM

## 2014-06-23 NOTE — Progress Notes (Signed)
Physical Therapy Treatment Patient Details Name: George Monroe. MRN: [id number] DOB: 08/15/1951 Today's Date: 06/23/2014    History of Present Illness 63 y.o. male s/p right total knee arthroplasty.    PT Comments    Patient is progressing with ambulation. Planning to DC to snf this afternoon  Follow Up Recommendations  SNF;Supervision for mobility/OOB     Equipment Recommendations  Rolling walker with 5" wheels;3in1 (PT)    Recommendations for Other Services       Precautions / Restrictions Precautions Precautions: Knee Precaution Comments: Reviewed knee precautions Restrictions Weight Bearing Restrictions: No RLE Weight Bearing: Weight bearing as tolerated    Mobility  Bed Mobility               General bed mobility comments: patient sitting EOB upon entering room  Transfers Overall transfer level: Needs assistance Equipment used: Rolling walker (2 wheeled)   Sit to Stand: Min assist         General transfer comment: Min assist for boost to stand from lowest bed setting. VC for hand placement.  Ambulation/Gait Ambulation/Gait assistance: Min assist Ambulation Distance (Feet): 100 Feet Assistive device: Rolling walker (2 wheeled) Gait Pattern/deviations: Step-to pattern;Step-through pattern   Gait velocity interpretation: Below normal speed for age/gender General Gait Details: Patietn continues to work on step through gait pattern. Some instability and hesitation but no LOB   Information systems manager Rankin (Stroke Patients Only)       Balance                                    Cognition Arousal/Alertness: Awake/alert Behavior During Therapy: WFL for tasks assessed/performed Overall Cognitive Status: Within Functional Limits for tasks assessed                      Exercises Total Joint Exercises Quad Sets: AROM;Right;10 reps;Seated Heel Slides: AAROM;Right;10 reps;Seated Hip  ABduction/ADduction: AAROM;Right;10 reps Straight Leg Raises: AAROM;Right;10 reps Long Arc Quad: AAROM;Right;Seated;10 reps    General Comments        Pertinent Vitals/Pain Pain Score: 3  Pain Location: R knee Pain Descriptors / Indicators: Sore Pain Intervention(s): Monitored during session    Home Living                      Prior Function            PT Goals (current goals can now be found in the care plan section) Progress towards PT goals: Progressing toward goals    Frequency  7X/week    PT Plan Current plan remains appropriate    Co-evaluation             End of Session Equipment Utilized During Treatment: Gait belt Activity Tolerance: Patient tolerated treatment well Patient left: in chair;with call bell/phone within reach     Time: 0817-0844 PT Time Calculation (min): 27 min  Charges:  $Gait Training: 8-22 mins $Therapeutic Exercise: 8-22 mins                    G Codes:      Fredrich Birks 06/23/2014, 8:54 AM 06/23/2014 Fredrich Birks PTA [phone] pager [phone] office

## 2014-06-24 ENCOUNTER — Encounter (HOSPITAL_COMMUNITY): Payer: Self-pay | Admitting: Orthopedic Surgery

## 2014-06-24 LAB — GLUCOSE, CAPILLARY
GLUCOSE-CAPILLARY: 48 mg/dL — AB (ref 70–99)
GLUCOSE-CAPILLARY: 199 mg/dL — AB (ref 70–99)
Glucose-Capillary: 178 mg/dL — ABNORMAL HIGH (ref 70–99)

## 2014-06-24 NOTE — Progress Notes (Signed)
Report called to [facility] pt transferred per ambulance

## 2014-06-24 NOTE — Progress Notes (Signed)
Physical Therapy Treatment Patient Details Name: George Monroe. MRN: [id number] DOB: December 20, 1950 Today's Date: 06/24/2014    History of Present Illness 63 y.o. male s/p right total knee arthroplasty.    PT Comments    Patient making steady progress. Able to increase therex. Awaiting SNF placement at this time.   Follow Up Recommendations  SNF;Supervision for mobility/OOB     Equipment Recommendations  Rolling walker with 5" wheels;3in1 (PT)    Recommendations for Other Services       Precautions / Restrictions Precautions Precautions: Knee Precaution Comments: Reviewed knee precautions Restrictions Weight Bearing Restrictions: No RLE Weight Bearing: Weight bearing as tolerated    Mobility  Bed Mobility Overal bed mobility: Needs Assistance Bed Mobility: Supine to Sit     Supine to sit: Supervision        Transfers Overall transfer level: Needs assistance Equipment used: Rolling walker (2 wheeled)   Sit to Stand: Min guard         General transfer comment: Cues for safe technique  Ambulation/Gait Ambulation/Gait assistance: Min guard Ambulation Distance (Feet): 110 Feet Assistive device: Rolling walker (2 wheeled)     Gait velocity interpretation: Below normal speed for age/gender General Gait Details: Patient having to take several short resting break. Cues for upright posture and heel strike   Stairs            Wheelchair Mobility    Modified Rankin (Stroke Patients Only)       Balance                                    Cognition Arousal/Alertness: Awake/alert Behavior During Therapy: WFL for tasks assessed/performed Overall Cognitive Status: Within Functional Limits for tasks assessed                      Exercises Total Joint Exercises Heel Slides: AAROM;Right;Seated;15 reps Straight Leg Raises: AAROM;Right;15 reps Long Arc Quad: Right;Seated;15 reps;AROM    General Comments        Pertinent  Vitals/Pain Pain Assessment: 0-10 Pain Score: 4  Pain Location: R knee Pain Descriptors / Indicators: Sore Pain Intervention(s): Monitored during session    Home Living                      Prior Function            PT Goals (current goals can now be found in the care plan section) Progress towards PT goals: Progressing toward goals    Frequency  7X/week    PT Plan Current plan remains appropriate    Co-evaluation             End of Session Equipment Utilized During Treatment: Gait belt Activity Tolerance: Patient tolerated treatment well Patient left: in chair;with call bell/phone within reach     Time: 0957-1020 PT Time Calculation (min): 23 min  Charges:  $Gait Training: 8-22 mins $Therapeutic Exercise: 8-22 mins                    G Codes:      Fredrich Birks 06/24/2014, 11:49 AM 06/24/2014 Fredrich Birks PTA [phone] pager [phone] office

## 2014-06-24 NOTE — Care Management Note (Signed)
CARE MANAGEMENT NOTE 06/24/2014  Patient:  George Monroe, George Monroe   Account Number:  [id number]  Date Initiated:  06/24/2014  Documentation initiated by:  Vance Peper  Subjective/Objective Assessment:   63 yr old male s/p right total knee arthroplasty.     Action/Plan:   Patient will need shortterm rehab at [facility]. Plans to go to [facility] at discharge. Social Worker is aware.   Anticipated DC Date:  06/24/2014   Anticipated DC Plan:  SKILLED NURSING FACILITY  In-house referral  Clinical Social Worker      DC Planning Services  CM consult      [facility] Choice  NA   Choice offered to / List presented to:     DME arranged  NA        HH arranged  NA      Status of service:  Completed, signed off Medicare Important Message given?   (If response is "NO", the following Medicare IM given date fields will be blank) Date Medicare IM given:   Medicare IM given by:   Date Additional Medicare IM given:   Additional Medicare IM given by:    Discharge Disposition:  SKILLED NURSING FACILITY  Per UR Regulation:  Reviewed for med. necessity/level of care/duration of stay

## 2014-06-24 NOTE — Progress Notes (Signed)
Inpatient Diabetes Program Recommendations  AACE/ADA: New Consensus Statement on Inpatient Glycemic Control (2013)  Target Ranges:  Prepandial:   less than 140 mg/dL      Peak postprandial:   less than 180 mg/dL (1-2 hours)      Critically ill patients:  140 - 180 mg/dL   Reason for Assessment:  Results for [name], [name] (MRN [id number]) as of 06/24/2014 11:54  Ref. Range 06/24/2014 07:03 06/24/2014 07:56  Glucose-Capillary Latest Range: 70-99 mg/dL 48 (L) 409 (H)   Diabetes history: Type 2 Diabetes Current orders for Inpatient glycemic control:  Lantus 30 units daily, Glucotrol 10 mg bid, Novolog moderate tid with meals  Note patient to discharge to SNF for rehab.  May consider reducing Lantus to 25 units daily if fasting CBG's remain less than 100 mg/dL.    Thanks, Beryl Meager, RN, BC-ADM Inpatient Diabetes Coordinator Pager [phone]

## 2014-06-24 NOTE — Progress Notes (Signed)
Patient ID: George Biglow., male   DOB: Feb 07, 1951, 63 y.o.   MRN: [id number] PATIENT ID: George Monroe.  MRN: [id number]  DOB/AGE:  Mar 16, 1951 / 63 y.o.  4 Days Post-Op Procedure(s) (LRB): TOTAL KNEE ARTHROPLASTY (Right)    PROGRESS NOTE Subjective: Patient is alert, oriented, no Nausea, no Vomiting, yes passing gas, x2 Bowel Movement. Taking PO well. Denies SOB, Chest or Calf Pain. Using Incentive Spirometer, PAS in place. Ambulate WBAT in hall, CPM 0-60 Patient reports pain as 3 on 0-10 scale  .    Objective: Vital signs in last 24 hours: Filed Vitals:   06/23/14 1200 06/23/14 1331 06/23/14 1600 06/23/14 2058  BP:  137/81  146/69  Pulse:  88  100  Temp:  98.1 F (36.7 C)  99.6 F (37.6 C)  TempSrc:  Oral  Oral  Resp: Height:      Weight:      SpO2: 98% 98% 98% 99%      Intake/Output from previous day: I/O last 3 completed shifts: In: 980 [P.O.:980] Out: -    Intake/Output this shift:     LABORATORY DATA:  Recent Labs  06/22/14 0426  06/23/14 0505  06/23/14 1609 06/23/14 2103 06/24/14 0703  WBC 15.7*  --  12.7*  --   --   --   --   HGB 12.4*  --  12.2*  --   --   --   --   HCT 37.7*  --  37.3*  --   --   --   --   PLT 206  --  214  --   --   --   --   GLUCAP  --   < >  --   < > 148* 211* 48*  < > = values in this interval not displayed.  Examination: Neurologically intact ABD soft Neurovascular intact Sensation intact distally Intact pulses distally Dorsiflexion/Plantar flexion intact Incision: no drainage No cellulitis present Compartment soft}  Assessment:   4 Days Post-Op Procedure(s) (LRB): TOTAL KNEE ARTHROPLASTY (Right) ADDITIONAL DIAGNOSIS: Expected Acute Blood Loss Anemia, Diabetes and Hypertension  Plan: PT/OT WBAT, CPM 5/hrs day until ROM 0-90 degrees, then D/C CPM DVT Prophylaxis:  SCDx72hrs, ASA 325 mg BID x 2 weeks DISCHARGE PLAN: Skilled Nursing Facility/Rehab, awaiting facility, ready for D/C DISCHARGE NEEDS: HHPT,  HHRN, CPM, Walker and 3-in-1 comode seat     George Monroe J 06/24/2014, 7:29 AM

## 2014-06-25 ENCOUNTER — Other Ambulatory Visit: Payer: Self-pay | Admitting: *Deleted

## 2014-06-25 ENCOUNTER — Encounter: Payer: Self-pay | Admitting: Adult Health

## 2014-06-25 ENCOUNTER — Non-Acute Institutional Stay (SKILLED_NURSING_FACILITY): Payer: 59 | Admitting: Adult Health

## 2014-06-25 DIAGNOSIS — M1710 Unilateral primary osteoarthritis, unspecified knee: Secondary | ICD-10-CM

## 2014-06-25 DIAGNOSIS — I10 Essential (primary) hypertension: Secondary | ICD-10-CM | POA: Insufficient documentation

## 2014-06-25 DIAGNOSIS — IMO0001 Reserved for inherently not codable concepts without codable children: Secondary | ICD-10-CM | POA: Insufficient documentation

## 2014-06-25 DIAGNOSIS — F329 Major depressive disorder, single episode, unspecified: Secondary | ICD-10-CM

## 2014-06-25 DIAGNOSIS — F3289 Other specified depressive episodes: Secondary | ICD-10-CM

## 2014-06-25 DIAGNOSIS — M1711 Unilateral primary osteoarthritis, right knee: Secondary | ICD-10-CM

## 2014-06-25 DIAGNOSIS — G4700 Insomnia, unspecified: Secondary | ICD-10-CM

## 2014-06-25 DIAGNOSIS — F32A Depression, unspecified: Secondary | ICD-10-CM | POA: Insufficient documentation

## 2014-06-25 DIAGNOSIS — E785 Hyperlipidemia, unspecified: Secondary | ICD-10-CM

## 2014-06-25 DIAGNOSIS — E1165 Type 2 diabetes mellitus with hyperglycemia: Secondary | ICD-10-CM

## 2014-06-25 DIAGNOSIS — IMO0002 Reserved for concepts with insufficient information to code with codable children: Secondary | ICD-10-CM

## 2014-06-25 MED ORDER — TESTOSTERONE CYPIONATE 200 MG/ML IM SOLN: 200.0000 mg | INTRAMUSCULAR | Status: AC

## 2014-06-25 MED ORDER — TESTOSTERONE CYPIONATE 200 MG/ML IM SOLN: 200.0000 mg | INTRAMUSCULAR | Status: DC

## 2014-06-25 NOTE — Progress Notes (Signed)
Patient ID: George Prosch., male   DOB: 04-08-1951, 63 y.o.   MRN: [id number]               PROGRESS NOTE  DATE: 06/25/2014  FACILITY: Nursing Home Location: [facility]  LEVEL OF CARE: SNF (31)  Acute Visit  CHIEF COMPLAINT:  Follow-up Hospitalization  HISTORY OF PRESENT ILLNESS: This is a 63 year old male who has been admitted to [facility] on 06/24/14 from [facility] with Arthritis of right knee S/P Right total knee arthroplasty. He has been admitted for a short-term rehabilitation.  REASSESSMENT OF ONGOING PROBLEM(S):  HTN: Pt 's HTN remains stable.  Denies CP, sob, DOE, headaches, dizziness or visual disturbances.  No complications from the medications currently being used.  Last BP : 120/72  DM:pt's DM remains stable.  Pt denies polyuria, polydipsia, polyphagia, changes in vision or hypoglycemic episodes.  No complications noted from the medication presently being used.  8/15 hemoglobin A1c is: 6.9  DEPRESSION: The depression remains stable. Patient denies ongoing feelings of sadness, insomnia, anedhonia or lack of appetite. No complications reported from the medications currently being used. Staff do not report behavioral problems.  PAST MEDICAL HISTORY : Reviewed.  No changes/see problem list  CURRENT MEDICATIONS: Reviewed per MAR/see medication list  REVIEW OF SYSTEMS:  GENERAL: no change in appetite, no fatigue, no weight changes, no fever, chills or weakness RESPIRATORY: no cough, SOB, DOE, wheezing, hemoptysis CARDIAC: no chest pain, edema or palpitations GI: no abdominal pain, diarrhea, constipation, heart burn, nausea or vomiting  PHYSICAL EXAMINATION  GENERAL: no acute distress, obese EYES: conjunctivae normal, sclerae normal, normal eye lids NECK: supple, trachea midline, no neck masses, no thyroid tenderness, no thyromegaly LYMPHATICS: no LAN in the neck, no supraclavicular LAN RESPIRATORY: breathing is even & unlabored, BS  CTAB CARDIAC: RRR, no murmur,no extra heart sounds, BLE edema 2+ GI: abdomen soft, normal BS, no masses, no tenderness, no hepatomegaly, no splenomegaly EXTREMITIES:  able to move all 4 extremities; limited ROM on RLE due to surgery PSYCHIATRIC: the patient is alert & oriented to person, affect & behavior appropriate  LABS/RADIOLOGY: Labs reviewed: Basic Metabolic Panel:  Recent Labs  40/98/11 1045  NA 139  K 4.3  CL 100  CO2 26  GLUCOSE 163*  BUN 20  CREATININE 1.60*  CALCIUM 9.3   CBC:  Recent Labs  06/12/14 1045 06/21/14 0640 06/22/14 0426 06/23/14 0505  WBC 7.1 11.7* 15.7* 12.7*  NEUTROABS 5.2  --   --   --   HGB 13.3 12.9* 12.4* 12.2*  HCT 41.0 41.5 37.7* 37.3*  MCV 87.0 87.2 84.3 83.3  PLT 203 210 206 214    CBG:  Recent Labs  06/24/14 0703 06/24/14 0756 06/24/14 1119  GLUCAP 48* 178* 199*     EXAM: CHEST  2 VIEW   COMPARISON:  None.   FINDINGS: Cardiomediastinal silhouette is unremarkable. No acute infiltrate or pleural effusion. No pulmonary edema. Mild degenerative changes thoracic spine.   IMPRESSION: No active cardiopulmonary disease.    ASSESSMENT/PLAN:  Arthritis of right knee status post right total knee arthroplasty - for rehabilitation Hyperlipidemia - continue Li[pitor Hypertension - well-controlled; continue chlorthalidone, Cozaar and Spironolactone Depression - continue Zoloft Diabetes mellitus, type II - well-controlled; continue glipizide, Lantus and metformin Insomnia - continue trazodone   CPT CODE: 91478  Ella Bodo - NP [facility] [phone]

## 2014-06-26 ENCOUNTER — Non-Acute Institutional Stay (SKILLED_NURSING_FACILITY): Payer: 59 | Admitting: Internal Medicine

## 2014-06-26 DIAGNOSIS — M1710 Unilateral primary osteoarthritis, unspecified knee: Secondary | ICD-10-CM

## 2014-06-26 DIAGNOSIS — M1711 Unilateral primary osteoarthritis, right knee: Secondary | ICD-10-CM

## 2014-06-26 DIAGNOSIS — J45909 Unspecified asthma, uncomplicated: Secondary | ICD-10-CM

## 2014-06-26 DIAGNOSIS — IMO0002 Reserved for concepts with insufficient information to code with codable children: Secondary | ICD-10-CM

## 2014-06-26 DIAGNOSIS — G4700 Insomnia, unspecified: Secondary | ICD-10-CM

## 2014-06-26 DIAGNOSIS — E119 Type 2 diabetes mellitus without complications: Secondary | ICD-10-CM

## 2014-06-27 ENCOUNTER — Encounter: Payer: Self-pay | Admitting: Adult Health

## 2014-06-27 ENCOUNTER — Non-Acute Institutional Stay (SKILLED_NURSING_FACILITY): Payer: 59 | Admitting: Adult Health

## 2014-06-27 DIAGNOSIS — F3289 Other specified depressive episodes: Secondary | ICD-10-CM

## 2014-06-27 DIAGNOSIS — IMO0002 Reserved for concepts with insufficient information to code with codable children: Secondary | ICD-10-CM

## 2014-06-27 DIAGNOSIS — I10 Essential (primary) hypertension: Secondary | ICD-10-CM

## 2014-06-27 DIAGNOSIS — F32A Depression, unspecified: Secondary | ICD-10-CM

## 2014-06-27 DIAGNOSIS — M1710 Unilateral primary osteoarthritis, unspecified knee: Secondary | ICD-10-CM

## 2014-06-27 DIAGNOSIS — E1165 Type 2 diabetes mellitus with hyperglycemia: Secondary | ICD-10-CM

## 2014-06-27 DIAGNOSIS — G4700 Insomnia, unspecified: Secondary | ICD-10-CM

## 2014-06-27 DIAGNOSIS — IMO0001 Reserved for inherently not codable concepts without codable children: Secondary | ICD-10-CM

## 2014-06-27 DIAGNOSIS — M1711 Unilateral primary osteoarthritis, right knee: Secondary | ICD-10-CM

## 2014-06-27 DIAGNOSIS — F329 Major depressive disorder, single episode, unspecified: Secondary | ICD-10-CM

## 2014-06-27 DIAGNOSIS — E785 Hyperlipidemia, unspecified: Secondary | ICD-10-CM

## 2014-06-27 NOTE — Progress Notes (Signed)
Patient ID: Jasani Dolney., male   DOB: 1951/03/16, 63 y.o.   MRN: [id number]              PROGRESS NOTE  DATE:  06/27/14  FACILITY: Nursing Home Location: [facility]  LEVEL OF CARE: SNF (31)  Acute Visit  CHIEF COMPLAINT:  Discharge Notes  HISTORY OF PRESENT ILLNESS: This is a 63 year old male who is for discharge home with outpatient rehabilitation.. DME: Rolling walker. He has been admitted to [facility] on 06/24/14 from [facility] with Arthritis of right knee S/P Right total knee arthroplasty. Patient was admitted to this facility for short-term rehabilitation after the patient's recent hospitalization.  Patient has completed SNF rehabilitation and therapy has cleared the patient for discharge.  REASSESSMENT OF ONGOING PROBLEM(S):  HTN: Pt 's HTN remains stable.  Denies CP, sob, DOE, headaches, dizziness or visual disturbances.  No complications from the medications currently being used.  Last BP : 134/85  HYPERLIPIDEMIA: No complications from the medications presently being used. Last fasting lipid panel showed : not available  INSOMNIA: The insomnia remains stable.  No complications noted from the medications presently being used. Patient denies ongoing insomnia, pain, hallucinations, delusions.  PAST MEDICAL HISTORY : Reviewed.  No changes/see problem list  CURRENT MEDICATIONS: Reviewed per MAR/see medication list  REVIEW OF SYSTEMS:  GENERAL: no change in appetite, no fatigue, no weight changes, no fever, chills or weakness RESPIRATORY: no cough, SOB, DOE, wheezing, hemoptysis CARDIAC: no chest pain, edema or palpitations GI: no abdominal pain, diarrhea, constipation, heart burn, nausea or vomiting  PHYSICAL EXAMINATION  GENERAL: no acute distress, obese NECK: supple, trachea midline, no neck masses, no thyroid tenderness, no thyromegaly LYMPHATICS: no LAN in the neck, no supraclavicular LAN RESPIRATORY: breathing is even & unlabored, BS  CTAB CARDIAC: RRR, no murmur,no extra heart sounds, BLE edema 2+ GI: abdomen soft, normal BS, no masses, no tenderness, no hepatomegaly, no splenomegaly EXTREMITIES:  able to move all 4 extremities PSYCHIATRIC: the patient is alert & oriented to person, affect & behavior appropriate  LABS/RADIOLOGY: Labs reviewed: Basic Metabolic Panel:  Recent Labs  40/34/74 1045  NA 139  K 4.3  CL 100  CO2 26  GLUCOSE 163*  BUN 20  CREATININE 1.60*  CALCIUM 9.3   CBC:  Recent Labs  06/12/14 1045 06/21/14 0640 06/22/14 0426 06/23/14 0505  WBC 7.1 11.7* 15.7* 12.7*  NEUTROABS 5.2  --   --   --   HGB 13.3 12.9* 12.4* 12.2*  HCT 41.0 41.5 37.7* 37.3*  MCV 87.0 87.2 84.3 83.3  PLT 203 210 206 214    CBG:  Recent Labs  06/24/14 0703 06/24/14 0756 06/24/14 1119  GLUCAP 48* 178* 199*     EXAM: CHEST  2 VIEW   COMPARISON:  None.   FINDINGS: Cardiomediastinal silhouette is unremarkable. No acute infiltrate or pleural effusion. No pulmonary edema. Mild degenerative changes thoracic spine.   IMPRESSION: No active cardiopulmonary disease.    ASSESSMENT/PLAN:  Arthritis of right knee status post right total knee arthroplasty - for outpatient rehabilitation Hyperlipidemia - continue Li[pitor Hypertension - well-controlled; continue chlorthalidone, Cozaar and Spironolactone Depression - stable; continue Zoloft Diabetes mellitus, type II - well-controlled; continue glipizide, Lantus and metformin Insomnia - continue trazodone  I have filled out patient's discharge paperwork and written prescriptions.  Patient will have outpatient rehabilitation.  DME provided: Rolling walker  Total discharge time: Greater than 30 minutes  Discharge time involved coordination of the discharge  process with Child psychotherapist, nursing staff and therapy department.     CPT CODE: 66440  Ella Bodo - NP [facility] [phone]

## 2014-06-28 DIAGNOSIS — J45909 Unspecified asthma, uncomplicated: Secondary | ICD-10-CM | POA: Insufficient documentation

## 2014-06-28 DIAGNOSIS — E119 Type 2 diabetes mellitus without complications: Secondary | ICD-10-CM | POA: Insufficient documentation

## 2014-06-28 NOTE — Progress Notes (Signed)
HISTORY & PHYSICAL  DATE: 06/26/2014   FACILITY: [facility]  LEVEL OF CARE: SNF (31)  ALLERGIES:  No Known Allergies  CHIEF COMPLAINT:  Manage right knee OA, asthma & DM  HISTORY OF PRESENT ILLNESS: Pt is a 63 y/o AA male.  KNEE OSTEOARTHRITIS: Patient had a history of pain and functional disability in the knee due to end-stage osteoarthritis and has failed nonsurgical conservative treatments. Patient had worsening of pain with activity and weight bearing, pain that interfered with activities of daily living & pain with passive range of motion. Therefore patient underwent total knee arthroplasty and tolerated the procedure well. Patient is admitted to this facility for sort short-term rehabilitation. Patient denies knee pain. C/o RLE swelling. . ASTHMA: The patient's asthma remains stable. Patient denies shortness of breath, dyspnea on exertion or wheezing. No complications reported from the medications currently being used.  DM:pt's DM remains stable.  Pt denies polyuria, polydipsia, polyphagia, changes in vision or hypoglycemic episodes.  No complications noted from the medication presently being used.  Last hemoglobin A1c is: not available.  PAST MEDICAL HISTORY :  Past Medical History  Diagnosis Date  . Hyperlipidemia     takes Atorvastatin daily  . Diabetes mellitus without complication     takes Glipidide,Metformin,and Lantus daily  . Depression     takes Zoloft daily  . Insomnia     takes Trazodone nightly  . Pneumonia 40+yrs ago  . Asthma     as needed inhaler  . Headache(784.0)     last migraine on 06/11/14-takes Vicodin as needed  . Arthritis   . Joint pain   . Joint swelling   . Back pain     reason unknown  . History of colon polyps   . Urinary frequency   . Hypertension     takes Losartan and Verapamil daily as well as SPironolactone and Chlorthalidone  . Glaucoma     borderline and uses eye drops    PAST SURGICAL  HISTORY: Past Surgical History  Procedure Laterality Date  . Eye surgery Left 1972  . Colonoscopy    . Total knee arthroplasty Right 06/20/2014    Procedure: TOTAL KNEE ARTHROPLASTY;  Surgeon: Nestor Lewandowsky, MD;  Location: [facility] OR;  Service: Orthopedics;  Laterality: Right;    SOCIAL HISTORY:  reports that he has quit smoking. He has never used smokeless tobacco. He reports that he drinks alcohol.  FAMILY HISTORY: none  CURRENT MEDICATIONS: Reviewed per MAR/see medication list  REVIEW OF SYSTEMS:  See HPI otherwise 14 point ROS is negative.  PHYSICAL EXAMINATION  VS:  See VS section  GENERAL: no acute distress, moderately obese body habitus EYES: conjunctivae normal, sclerae normal, normal eye lids MOUTH/THROAT: lips without lesions,no lesions in the mouth,tongue is without lesions,uvula elevates in midline NECK: supple, trachea midline, no neck masses, no thyroid tenderness, no thyromegaly LYMPHATICS: no LAN in the neck, no supraclavicular LAN RESPIRATORY: breathing is even & unlabored, BS CTAB CARDIAC: RRR, no murmur,no extra heart sounds, RLE +2 edema GI:  ABDOMEN: abdomen soft, normal BS, no masses, no tenderness  LIVER/SPLEEN: no hepatomegaly, no splenomegaly MUSCULOSKELETAL: HEAD: normal to inspection  EXTREMITIES: LEFT UPPER EXTREMITY: full range of motion, normal strength & tone RIGHT UPPER EXTREMITY:  full range of motion, normal strength & tone LEFT LOWER EXTREMITY:  full range of motion, normal strength & tone RIGHT LOWER EXTREMITY: range of motion not tested due to surgery, normal strength &  tone PSYCHIATRIC: the patient is alert & oriented to person, affect & behavior appropriate  LABS/RADIOLOGY:  Labs reviewed: Basic Metabolic Panel:  Recent Labs  16/10/96 1045  NA 139  K 4.3  CL 100  CO2 26  GLUCOSE 163*  BUN 20  CREATININE 1.60*  CALCIUM 9.3   CBC:  Recent Labs  06/12/14 1045 06/21/14 0640 06/22/14 0426 06/23/14 0505  WBC 7.1 11.7* 15.7*  12.7*  NEUTROABS 5.2  --   --   --   HGB 13.3 12.9* 12.4* 12.2*  HCT 41.0 41.5 37.7* 37.3*  MCV 87.0 87.2 84.3 83.3  PLT 203 210 206 214    Recent Labs  06/24/14 0703 06/24/14 0756 06/24/14 1119  GLUCAP 48* 178* 199*    CHEST  2 VIEW   COMPARISON:  None.   FINDINGS: Cardiomediastinal silhouette is unremarkable. No acute infiltrate or pleural effusion. No pulmonary edema. Mild degenerative changes thoracic spine.   IMPRESSION: No active cardiopulmonary disease.    ASSESSMENT/PLAN:  Right knee OA-s/p total knee arthroplasty.  Cont.rehabilitation. Asthma-well compensated DM-cont. Metformin, lantus & glipizide HTN-well controlled. Insomnia-cont trazodone Check cbc w diff  I have reviewed patient's medical records received at admission/from hospitalization.  CPT CODE: 04540  Angela Cox, MD [facility] [phone]

## 2014-07-14 ENCOUNTER — Ambulatory Visit: Payer: PRIVATE HEALTH INSURANCE | Admitting: Podiatry

## 2014-07-23 ENCOUNTER — Ambulatory Visit: Payer: PRIVATE HEALTH INSURANCE | Admitting: Podiatry

## 2015-03-17 IMAGING — CR DG CHEST 2V
2 series · 2 of 2 positions shown · non-contrast
Comparison: None.

CLINICAL DATA: Preop, right knee replacement

EXAM:
CHEST  2 VIEW

[w chest pa]
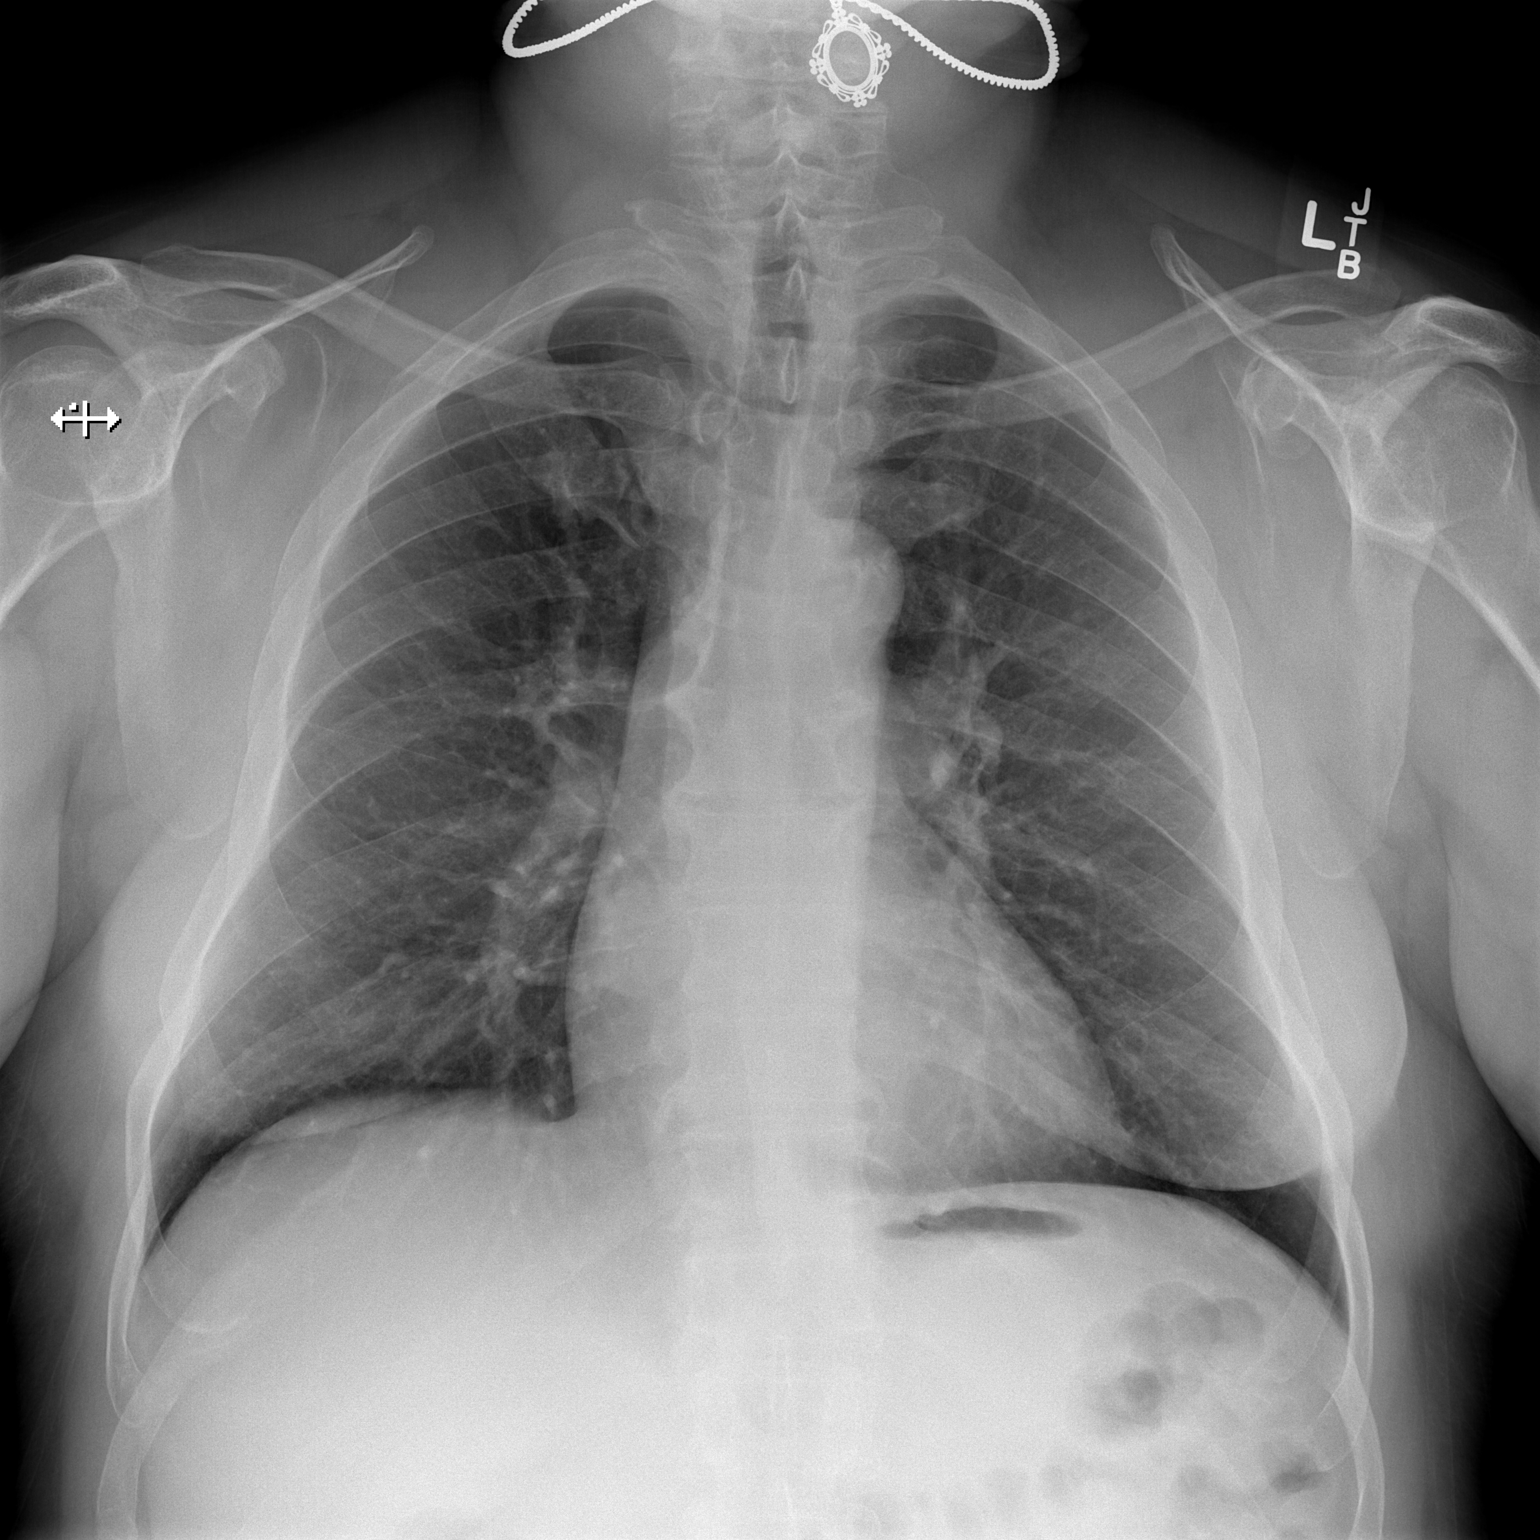

[w chest lat]
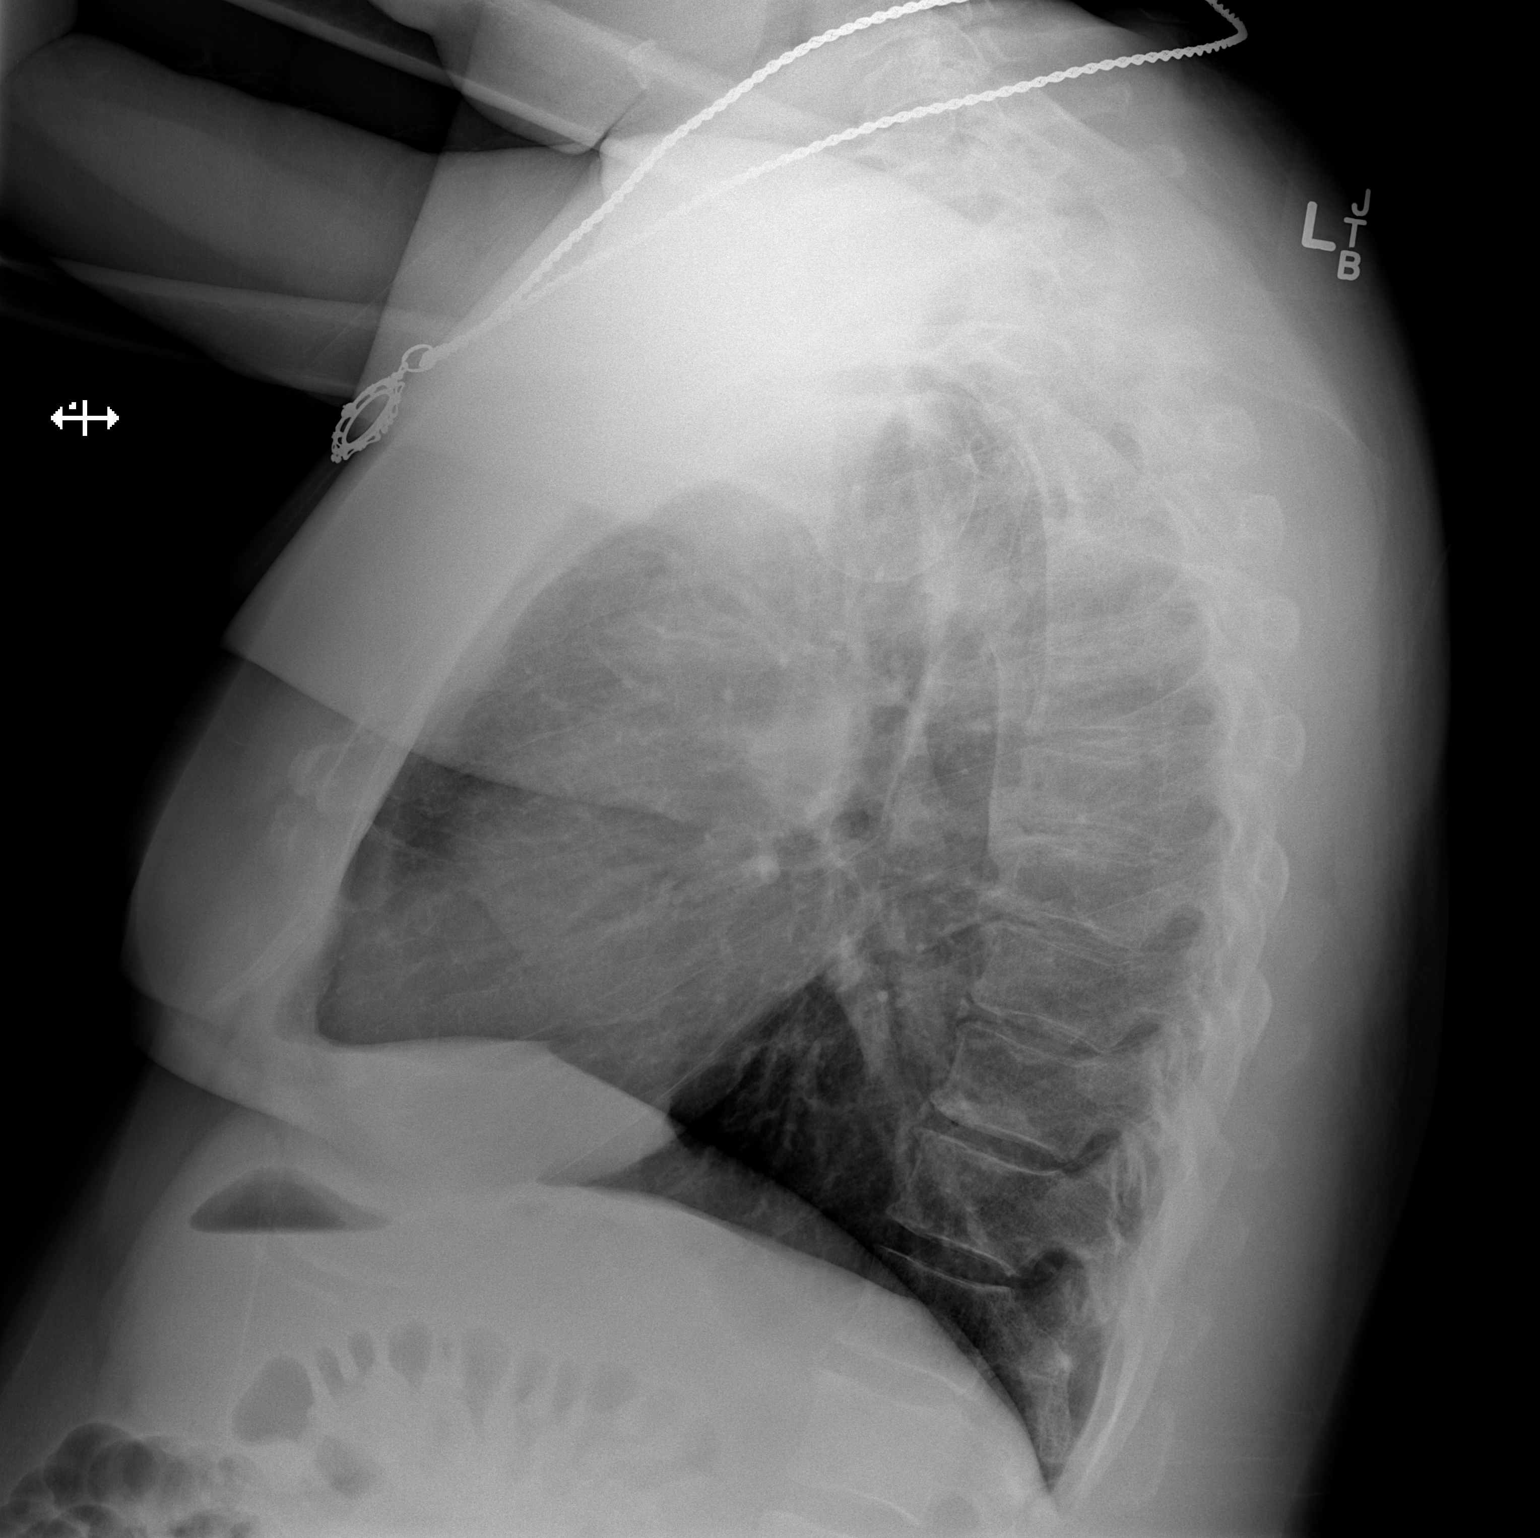

[2 of 2 positions shown; findings below may reference images not displayed]

FINDINGS: Cardiomediastinal silhouette is unremarkable. No acute infiltrate or
pleural effusion. No pulmonary edema. Mild degenerative changes
thoracic spine.
IMPRESSION: No active cardiopulmonary disease.

## 2017-08-31 DEATH — deceased

## 2020-06-08 ENCOUNTER — Ambulatory Visit: Payer: Self-pay | Admitting: Student

## 2020-06-15 ENCOUNTER — Ambulatory Visit: Payer: Self-pay | Admitting: Student

## 2020-06-15 NOTE — H&P (View-Only) (Signed)
TOTAL KNEE ADMISSION H&P  Patient is being admitted for left total knee arthroplasty.  Subjective:  Chief Complaint:left knee pain.  HPI: George Monroe., 69 y.o. male, has a history of pain and functional disability in the left knee due to arthritis and has failed non-surgical conservative treatments for greater than 12 weeks to includeNSAID's and/or analgesics, corticosteriod injections and activity modification.  Onset of symptoms was gradual, starting 3 years ago with gradually worsening course since that time. The patient noted no past surgery on the left knee(s).  Patient currently rates pain in the left knee(s) at 8 out of 10 with activity. Patient has worsening of pain with activity and weight bearing, pain that interferes with activities of daily living and pain with passive range of motion.  Patient has evidence of joint space narrowing by imaging studies.  There is no active infection.  Patient Active Problem List   Diagnosis Date Noted   Extrinsic asthma, unspecified 06/28/2014   Type II or unspecified type diabetes mellitus without mention of complication, not stated as uncontrolled 06/28/2014   Hyperlipidemia 06/25/2014   Hypertension 06/25/2014   Depression 06/25/2014   Type II or unspecified type diabetes mellitus without mention of complication, uncontrolled 06/25/2014   Insomnia 06/25/2014   Arthritis of right knee 06/20/2014   Onychomycosis 05/14/2014   Pain in lower limb 05/14/2014   Equinus deformity of foot, acquired 05/14/2014   Past Medical History:  Diagnosis Date   Arthritis    Asthma    as needed inhaler   Back pain    reason unknown   Depression    takes Zoloft daily   Diabetes mellitus without complication    takes Glipidide,Metformin,and Lantus daily   Glaucoma    borderline and uses eye drops   Headache(784.0)    last migraine on 06/11/14-takes Vicodin as needed   History of colon polyps    Hyperlipidemia    takes  Atorvastatin daily   Hypertension    takes Losartan and Verapamil daily as well as SPironolactone and Chlorthalidone   Insomnia    takes Trazodone nightly   Joint pain    Joint swelling    Pneumonia 40+yrs ago   Urinary frequency     Past Surgical History:  Procedure Laterality Date   COLONOSCOPY     EYE SURGERY Left 1972   TOTAL KNEE ARTHROPLASTY Right 06/20/2014   Procedure: TOTAL KNEE ARTHROPLASTY;  Surgeon: Nestor Lewandowsky, MD;  Location: [facility] OR;  Service: Orthopedics;  Laterality: Right;    Current Outpatient Medications  Medication Sig Dispense Refill Last Dose   aspirin EC 325 MG tablet Take 1 tablet (325 mg total) by mouth 2 (two) times daily. 30 tablet 0    atorvastatin (LIPITOR) 40 MG tablet Take 20 mg by mouth daily.      betamethasone valerate (VALISONE) 0.1 % cream Apply topically 2 (two) times daily.      chlorthalidone (HYGROTON) 25 MG tablet Take 12.5 mg by mouth daily.       Cholecalciferol 2000 UNITS TABS Take 4,000 Units by mouth daily. Two tabs by mouth daily      cyanocobalamin 500 MCG tablet Take 500 mcg by mouth daily.      diclofenac sodium (VOLTAREN) 1 % GEL Apply topically 2 (two) times daily.      glipiZIDE (GLUCOTROL) 10 MG tablet Take 10 mg by mouth 2 (two) times daily before a meal.      glucose blood (PRECISION XTRA TEST STRIPS) test strip 1  each by Other route as needed for other. Use as instructed      guaiFENesin (ROBITUSSIN) 100 MG/5ML liquid Take 200 mg by mouth 3 (three) times daily as needed for cough.      insulin glargine (LANTUS) 100 UNIT/ML injection Inject 30 Units into the skin daily.       Insulin Pen Needle (NOVOFINE) 30G X 8 MM MISC Inject 1 packet into the skin as needed.      latanoprost (XALATAN) 0.005 % ophthalmic solution Place 1 drop into both eyes at bedtime.      losartan (COZAAR) 100 MG tablet Take 100 mg by mouth daily.      magnesium oxide (MAG-OX) 400 MG tablet Take 1,680 mg by mouth 2 (two) times daily.        metFORMIN (GLUCOPHAGE) 500 MG tablet Take 1,000 mg by mouth 2 (two) times daily with a meal.       methocarbamol (ROBAXIN) 500 MG tablet Take 1 tablet (500 mg total) by mouth 2 (two) times daily with a meal. 60 tablet 0    metroNIDAZOLE (FLAGYL) 500 MG tablet Take 500 mg by mouth 3 (three) times daily.      oxyCODONE-acetaminophen (ROXICET) 5-325 MG per tablet Take 1 tablet by mouth every 4 (four) hours as needed. 60 tablet 0    sertraline (ZOLOFT) 100 MG tablet Take 100 mg by mouth daily.      spironolactone (ALDACTONE) 25 MG tablet Take 25 mg by mouth daily.      tadalafil (CIALIS) 20 MG tablet Take 10 mg by mouth daily as needed for erectile dysfunction.      TECHLITE LANCETS MISC by Does not apply route.      testosterone cypionate (DEPOTESTOTERONE CYPIONATE) 200 MG/ML injection Inject 1 mL (200 mg total) into the muscle every 14 (fourteen) days. 10 mL 0    timolol (BETIMOL) 0.5 % ophthalmic solution Place 1 drop into both eyes 2 (two) times daily.      traZODone (DESYREL) 50 MG tablet Take 50 mg by mouth at bedtime.      verapamil (CALAN-SR) 240 MG CR tablet Take 240 mg by mouth at bedtime.      No current facility-administered medications for this visit.   No Known Allergies  Social History   Tobacco Use   Smoking status: Former Smoker   Smokeless tobacco: Never Used   Tobacco comment: quit smoking in 1995  Substance Use Topics   Alcohol use: Yes    Comment: beer monthly    No family history on file.   Review of Systems  Constitutional: Negative.   HENT: Negative.   Eyes: Negative.   Respiratory: Negative.   Cardiovascular: Negative.   Gastrointestinal: Negative.   Endocrine: Negative.   Genitourinary: Negative.   Musculoskeletal: Positive for arthralgias and myalgias.  Skin: Negative.   Allergic/Immunologic: Negative.   Neurological: Negative.   Hematological: Negative.   Psychiatric/Behavioral: Negative.     Objective:  Physical  Exam Constitutional:      Appearance: Normal appearance.  HENT:     Head: Normocephalic and atraumatic.     Right Ear: External ear normal.     Left Ear: External ear normal.  Eyes:     Pupils: Pupils are equal, round, and reactive to light.  Cardiovascular:     Rate and Rhythm: Normal rate and regular rhythm.  Pulmonary:     Effort: Pulmonary effort is normal.     Breath sounds: Normal breath sounds.  Abdominal:  Palpations: Abdomen is soft.     Tenderness: There is no abdominal tenderness.  Genitourinary:    Comments: Deferred Musculoskeletal:     Cervical back: Normal range of motion and neck supple.     Comments: Examination of the left knee reveals no skin wounds or lesions. He has some swelling, trace effusion. No warmth or erythema. He has tenderness to palpation medial joint line, lateral joint line, and peripatellar retinacular tissues with positive grind sign. Range of motion is 15-115 without any instability. No extensor lag. Painless range of motion of the hip.  Skin:    General: Skin is warm and dry.  Neurological:     Mental Status: He is alert and oriented to person, place, and time.  Psychiatric:        Mood and Affect: Mood normal.     Vital signs in last 24 hours: @VSRANGES @  Labs:   Estimated body mass index is 34.97 kg/m as calculated from the following:   Height as of 06/27/14: 5\' 9"  (1.753 m).   Weight as of 06/27/14: 107.4 kg.   Imaging Review Plain radiographs demonstrate severe degenerative joint disease of the left knee(s). The overall alignment isneutral. The bone quality appears to be adequate for age and reported activity level.      Assessment/Plan:  End stage arthritis, left knee   The patient history, physical examination, clinical judgment of the provider and imaging studies are consistent with end stage degenerative joint disease of the left knee(s) and total knee arthroplasty is deemed medically necessary. The treatment  options including medical management, injection therapy arthroscopy and arthroplasty were discussed at length. The risks and benefits of total knee arthroplasty were presented and reviewed. The risks due to aseptic loosening, infection, stiffness, patella tracking problems, thromboembolic complications and other imponderables were discussed. The patient acknowledged the explanation, agreed to proceed with the plan and consent was signed. Patient is being admitted for inpatient treatment for surgery, pain control, PT, OT, prophylactic antibiotics, VTE prophylaxis, progressive ambulation and ADL's and discharge planning. The patient is planning to be discharged home and will complete outpatient physical therapy     Patient's anticipated LOS is less than 2 midnights, meeting these requirements: - Lives within 1 hour of care - Has a competent adult at home to recover with post-op recover - NO history of  - Diabetes  - Coronary Artery Disease  - Heart failure  - Heart attack  - Stroke  - DVT/VTE  - Cardiac arrhythmia  - Respiratory Failure/COPD  - Renal failure  - Anemia  - Advanced Liver disease

## 2020-06-15 NOTE — H&P (Signed)
TOTAL KNEE ADMISSION H&P ° °Patient is being admitted for left total knee arthroplasty. ° °Subjective: ° °Chief Complaint:left knee pain. ° °HPI: George Snellgrove Jr., 68 y.o. male, has a history of pain and functional disability in the left knee due to arthritis and has failed non-surgical conservative treatments for greater than 12 weeks to includeNSAID's and/or analgesics, corticosteriod injections and activity modification.  Onset of symptoms was gradual, starting 3 years ago with gradually worsening course since that time. The patient noted no past surgery on the left knee(s).  Patient currently rates pain in the left knee(s) at 8 out of 10 with activity. Patient has worsening of pain with activity and weight bearing, pain that interferes with activities of daily living and pain with passive range of motion.  Patient has evidence of joint space narrowing by imaging studies.  There is no active infection. ° °Patient Active Problem List  ° Diagnosis Date Noted  °• Extrinsic asthma, unspecified 06/28/2014  °• Type II or unspecified type diabetes mellitus without mention of complication, not stated as uncontrolled 06/28/2014  °• Hyperlipidemia 06/25/2014  °• Hypertension 06/25/2014  °• Depression 06/25/2014  °• Type II or unspecified type diabetes mellitus without mention of complication, uncontrolled 06/25/2014  °• Insomnia 06/25/2014  °• Arthritis of right knee 06/20/2014  °• Onychomycosis 05/14/2014  °• Pain in lower limb 05/14/2014  °• Equinus deformity of foot, acquired 05/14/2014  ° °Past Medical History:  °Diagnosis Date  °• Arthritis   °• Asthma   ° as needed inhaler  °• Back pain   ° reason unknown  °• Depression   ° takes Zoloft daily  °• Diabetes mellitus without complication   ° takes Glipidide,Metformin,and Lantus daily  °• Glaucoma   ° borderline and uses eye drops  °• Headache(784.0)   ° last migraine on 06/11/14-takes Vicodin as needed  °• History of colon polyps   °• Hyperlipidemia   ° takes  Atorvastatin daily  °• Hypertension   ° takes Losartan and Verapamil daily as well as SPironolactone and Chlorthalidone  °• Insomnia   ° takes Trazodone nightly  °• Joint pain   °• Joint swelling   °• Pneumonia 40+yrs ago  °• Urinary frequency   °  °Past Surgical History:  °Procedure Laterality Date  °• COLONOSCOPY    °• EYE SURGERY Left 1972  °• TOTAL KNEE ARTHROPLASTY Right 06/20/2014  ° Procedure: TOTAL KNEE ARTHROPLASTY;  Surgeon: Frank J Rowan, MD;  Location: [facility] OR;  Service: Orthopedics;  Laterality: Right;  °  °Current Outpatient Medications  °Medication Sig Dispense Refill Last Dose  °• aspirin EC 325 MG tablet Take 1 tablet (325 mg total) by mouth 2 (two) times daily. 30 tablet 0   °• atorvastatin (LIPITOR) 40 MG tablet Take 20 mg by mouth daily.     °• betamethasone valerate (VALISONE) 0.1 % cream Apply topically 2 (two) times daily.     °• chlorthalidone (HYGROTON) 25 MG tablet Take 12.5 mg by mouth daily.      °• Cholecalciferol 2000 UNITS TABS Take 4,000 Units by mouth daily. Two tabs by mouth daily     °• cyanocobalamin 500 MCG tablet Take 500 mcg by mouth daily.     °• diclofenac sodium (VOLTAREN) 1 % GEL Apply topically 2 (two) times daily.     °• glipiZIDE (GLUCOTROL) 10 MG tablet Take 10 mg by mouth 2 (two) times daily before a meal.     °• glucose blood (PRECISION XTRA TEST STRIPS) test strip 1   each by Other route as needed for other. Use as instructed     °• guaiFENesin (ROBITUSSIN) 100 MG/5ML liquid Take 200 mg by mouth 3 (three) times daily as needed for cough.     °• insulin glargine (LANTUS) 100 UNIT/ML injection Inject 30 Units into the skin daily.      °• Insulin Pen Needle (NOVOFINE) 30G X 8 MM MISC Inject 1 packet into the skin as needed.     °• latanoprost (XALATAN) 0.005 % ophthalmic solution Place 1 drop into both eyes at bedtime.     °• losartan (COZAAR) 100 MG tablet Take 100 mg by mouth daily.     °• magnesium oxide (MAG-OX) 400 MG tablet Take 1,680 mg by mouth 2 (two) times daily.       °• metFORMIN (GLUCOPHAGE) 500 MG tablet Take 1,000 mg by mouth 2 (two) times daily with a meal.      °• methocarbamol (ROBAXIN) 500 MG tablet Take 1 tablet (500 mg total) by mouth 2 (two) times daily with a meal. 60 tablet 0   °• metroNIDAZOLE (FLAGYL) 500 MG tablet Take 500 mg by mouth 3 (three) times daily.     °• oxyCODONE-acetaminophen (ROXICET) 5-325 MG per tablet Take 1 tablet by mouth every 4 (four) hours as needed. 60 tablet 0   °• sertraline (ZOLOFT) 100 MG tablet Take 100 mg by mouth daily.     °• spironolactone (ALDACTONE) 25 MG tablet Take 25 mg by mouth daily.     °• tadalafil (CIALIS) 20 MG tablet Take 10 mg by mouth daily as needed for erectile dysfunction.     °• TECHLITE LANCETS MISC by Does not apply route.     °• testosterone cypionate (DEPOTESTOTERONE CYPIONATE) 200 MG/ML injection Inject 1 mL (200 mg total) into the muscle every 14 (fourteen) days. 10 mL 0   °• timolol (BETIMOL) 0.5 % ophthalmic solution Place 1 drop into both eyes 2 (two) times daily.     °• traZODone (DESYREL) 50 MG tablet Take 50 mg by mouth at bedtime.     °• verapamil (CALAN-SR) 240 MG CR tablet Take 240 mg by mouth at bedtime.     ° °No current facility-administered medications for this visit.  ° °No Known Allergies  °Social History  ° °Tobacco Use  °• Smoking status: Former Smoker  °• Smokeless tobacco: Never Used  °• Tobacco comment: quit smoking in 1995  °Substance Use Topics  °• Alcohol use: Yes  °  Comment: beer monthly  °  °No family history on file.  ° °Review of Systems  °Constitutional: Negative.   °HENT: Negative.   °Eyes: Negative.   °Respiratory: Negative.   °Cardiovascular: Negative.   °Gastrointestinal: Negative.   °Endocrine: Negative.   °Genitourinary: Negative.   °Musculoskeletal: Positive for arthralgias and myalgias.  °Skin: Negative.   °Allergic/Immunologic: Negative.   °Neurological: Negative.   °Hematological: Negative.   °Psychiatric/Behavioral: Negative.   ° ° °Objective: ° °Physical  Exam °Constitutional:   °   Appearance: Normal appearance.  °HENT:  °   Head: Normocephalic and atraumatic.  °   Right Ear: External ear normal.  °   Left Ear: External ear normal.  °Eyes:  °   Pupils: Pupils are equal, round, and reactive to light.  °Cardiovascular:  °   Rate and Rhythm: Normal rate and regular rhythm.  °Pulmonary:  °   Effort: Pulmonary effort is normal.  °   Breath sounds: Normal breath sounds.  °Abdominal:  °     Palpations: Abdomen is soft.  °   Tenderness: There is no abdominal tenderness.  °Genitourinary: °   Comments: Deferred °Musculoskeletal:  °   Cervical back: Normal range of motion and neck supple.  °   Comments: Examination of the left knee reveals no skin wounds or lesions. He has some swelling, trace effusion. No warmth or erythema. He has tenderness to palpation medial joint line, lateral joint line, and peripatellar retinacular tissues with positive grind sign. Range of motion is 15-115° without any instability. No extensor lag. Painless range of motion of the hip.  °Skin: °   General: Skin is warm and dry.  °Neurological:  °   Mental Status: He is alert and oriented to person, place, and time.  °Psychiatric:     °   Mood and Affect: Mood normal.  ° ° ° °Vital signs in last 24 hours: °@VSRANGES@ ° °Labs: ° ° °Estimated body mass index is 34.97 kg/m² as calculated from the following: °  Height as of 06/27/14: 5' 9" (1.753 m). °  Weight as of 06/27/14: 107.4 kg. ° ° °Imaging Review °Plain radiographs demonstrate severe degenerative joint disease of the left knee(s). The overall alignment isneutral. The bone quality appears to be adequate for age and reported activity level. ° ° ° ° ° °Assessment/Plan: ° °End stage arthritis, left knee  ° °The patient history, physical examination, clinical judgment of the provider and imaging studies are consistent with end stage degenerative joint disease of the left knee(s) and total knee arthroplasty is deemed medically necessary. The treatment  options including medical management, injection therapy arthroscopy and arthroplasty were discussed at length. The risks and benefits of total knee arthroplasty were presented and reviewed. The risks due to aseptic loosening, infection, stiffness, patella tracking problems, thromboembolic complications and other imponderables were discussed. The patient acknowledged the explanation, agreed to proceed with the plan and consent was signed. Patient is being admitted for inpatient treatment for surgery, pain control, PT, OT, prophylactic antibiotics, VTE prophylaxis, progressive ambulation and ADL's and discharge planning. The patient is planning to be discharged home and will complete outpatient physical therapy ° ° ° ° °Patient's anticipated LOS is less than 2 midnights, meeting these requirements: °- Lives within 1 hour of care °- Has a competent adult at home to recover with post-op recover °- NO history of ° - Diabetes ° - Coronary Artery Disease ° - Heart failure ° - Heart attack ° - Stroke ° - DVT/VTE ° - Cardiac arrhythmia ° - Respiratory Failure/COPD ° - Renal failure ° - Anemia ° - Advanced Liver disease ° ° ° ° °

## 2020-06-24 NOTE — Progress Notes (Signed)
COVID Vaccine Completed: Date COVID Vaccine completed: COVID vaccine manufacturer: Pfizer    [facility]   PCP - Not in system Cardiologist -   Chest x-ray -  EKG -  Stress Test -  ECHO -  Cardiac Cath -   Sleep Study -  CPAP -   Fasting Blood Sugar -  Checks Blood Sugar _____ times a day  Blood Thinner Instructions: Aspirin Instructions: 325 mg Last Dose:  Anesthesia review:   Patient denies shortness of breath, fever, cough and chest pain at PAT appointment   Patient verbalized understanding of instructions that were given to them at the PAT appointment. Patient was also instructed that they will need to review over the PAT instructions again at home before surgery.

## 2020-06-24 NOTE — Patient Instructions (Addendum)
DUE TO COVID-19 ONLY ONE VISITOR IS ALLOWED TO COME WITH YOU AND STAY IN THE WAITING ROOM ONLY DURING PRE OP AND PROCEDURE DAY OF SURGERY. THE 1 VISITOR  MAY VISIT WITH YOU AFTER SURGERY IN YOUR PRIVATE ROOM DURING VISITING HOURS ONLY!  YOU NEED TO HAVE A COVID 19 TEST ON 06-29-20 @ 2:40 PM,   THIS TEST MUST BE DONE BEFORE SURGERY,  COVID TESTING SITE [address] [place] [address], IT IS ON THE RIGHT GOING OUT [address] APPROXIMATELY  2 MINUTES PAST [employer] ON THE RIGHT. ONCE YOUR COVID TEST IS COMPLETED,  PLEASE BEGIN THE QUARANTINE INSTRUCTIONS AS OUTLINED IN YOUR HANDOUT.                 Your procedure is scheduled on: 07-02-20   Report to [facility] Main  Entrance    Report to Admitting at 8:15 AM     Call this number if you have problems the morning of surgery [phone]    REMEMBER: AFTER MIDNIGHT, NOTHING BY MOUTH EXCEPT CLEAR LIQUIDS UNTIL 7:30 AM.    PLEASE FINISH G2 DRINK PER SURGEON ORDER  WHICH NEEDS TO BE COMPLETED AT 7:30 AM .      CLEAR LIQUID DIET   Foods Allowed                                                                    Coffee and tea, regular and decaf                            Fruit ices (not with fruit pulp)                                      Iced Popsicles                                    Carbonated beverages, regular and diet                                    Cranberry, grape and apple juices Sports drinks like Gatorade Lightly seasoned clear broth or consume(fat free) Sugar, honey syrup ___________________________________________________________________        Take these medicines the morning of surgery with A SIP OF WATER: Sertraline (Zoloft)  BRUSH YOUR TEETH MORNING OF SURGERY AND RINSE YOUR MOUTH OUT, NO CHEWING GUM CANDY OR MINTS.  DO NOT TAKE ANY DIABETIC MEDICATIONS DAY OF YOUR SURGERY                               You may not have any metal on your body including piercings    Do not  wear jewelry, cologne, lotions, powders or deodorant                    Men may shave face and neck.     Do not bring valuables to the hospital.  [facility] IS NOT  RESPONSIBLE   FOR VALUABLES.   Contacts, dentures or bridgework may not be worn into surgery.  Leave suitcase in the car. After surgery it may be brought to your room.     Special Instructions: N/A              Please read over the following fact sheets you were given: _____________________________________________________________________    How to Manage Your Diabetes Before and After Surgery  Why is it important to control my blood sugar before and after surgery?  Improving blood sugar levels before and after surgery helps healing and can limit problems.  A way of improving blood sugar control is eating a healthy diet by: o  Eating less sugar and carbohydrates o  Increasing activity/exercise o  Talking with your doctor about reaching your blood sugar goals  High blood sugars (greater than 180 mg/dL) can raise your risk of infections and slow your recovery, so you will need to focus on controlling your diabetes during the weeks before surgery.  Make sure that the doctor who takes care of your diabetes knows about your planned surgery including the date and location.  How do I manage my blood sugar before surgery?  Check your blood sugar at least 4 times a day, starting 2 days before surgery, to make sure that the level is not too high or low. o Check your blood sugar the morning of your surgery when you wake up and every 2 hours until you get to the Short Stay unit.  If your blood sugar is less than 70 mg/dL, you will need to treat for low blood sugar: o Do not take insulin. o Treat a low blood sugar (less than 70 mg/dL) with  cup of clear juice (cranberry or apple), 4 glucose tablets, OR glucose gel. o Recheck blood sugar in 15 minutes after treatment (to make sure it is greater than 70 mg/dL). If your blood  sugar is not greater than 70 mg/dL on recheck, call [phone] for further instructions.  Report your blood sugar to the short stay nurse when you get to Short Stay.   If you are admitted to the hospital after surgery: o Your blood sugar will be checked by the staff and you will probably be given insulin after surgery (instead of oral diabetes medicines) to make sure you have good blood sugar levels. o The goal for blood sugar control after surgery is 80-180 mg/dL.   WHAT DO I DO ABOUT MY DIABETES MEDICATION?   Do not take oral diabetes medicines (pills) the morning of surgery.   THE DAY BEFORE SURGERY, take your usual Metformin and Glimepiride.     THE DAY OF SURGERY:  Do not take Metformin or Glimepiride     Reviewed and Endorsed by [facility] Patient Education Committee, August 2015   [facility] - Preparing for Surgery Before surgery, you can play an important role.  Because skin is not sterile, your skin needs to be as free of germs as possible.  You can reduce the number of germs on your skin by washing with CHG (chlorahexidine gluconate) soap before surgery.  CHG is an antiseptic cleaner which kills germs and bonds with the skin to continue killing germs even after washing. Please DO NOT use if you have an allergy to CHG or antibacterial soaps.  If your skin becomes reddened/irritated stop using the CHG and inform your nurse when you arrive at Short Stay. Do not shave (including legs  and underarms) for at least 48 hours prior to the first CHG shower.  You may shave your face/neck. Please follow these instructions carefully:  1.  Shower with CHG Soap the night before surgery and the  morning of Surgery.  2.  If you choose to wash your hair, wash your hair first as usual with your  normal  shampoo.  3.  After you shampoo, rinse your hair and body thoroughly to remove the  shampoo.                           4.  Use CHG as you would any other liquid soap.  You can apply chg  directly  to the skin and wash                       Gently with a scrungie or clean washcloth.  5.  Apply the CHG Soap to your body ONLY FROM THE NECK DOWN.   Do not use on face/ open                           Wound or open sores. Avoid contact with eyes, ears mouth and genitals (private parts).                       Wash face,  Genitals (private parts) with your normal soap.             6.  Wash thoroughly, paying special attention to the area where your surgery  will be performed.  7.  Thoroughly rinse your body with warm water from the neck down.  8.  DO NOT shower/wash with your normal soap after using and rinsing off  the CHG Soap.                9.  Pat yourself dry with a clean towel.            10.  Wear clean pajamas.            11.  Place clean sheets on your bed the night of your first shower and do not  sleep with pets. Day of Surgery : Do not apply any lotions/deodorants the morning of surgery.  Please wear clean clothes to the hospital/surgery center.  FAILURE TO FOLLOW THESE INSTRUCTIONS MAY RESULT IN THE CANCELLATION OF YOUR SURGERY PATIENT SIGNATURE_________________________________  NURSE SIGNATURE__________________________________  ________________________________________________________________________     Rogelia Mire  An incentive spirometer is a tool that can help keep your lungs clear and active. This tool measures how well you are filling your lungs with each breath. Taking long deep breaths may help reverse or decrease the chance of developing breathing (pulmonary) problems (especially infection) following:  A long period of time when you are unable to move or be active. BEFORE THE PROCEDURE   If the spirometer includes an indicator to show your best effort, your nurse or respiratory therapist will set it to a desired goal.  If possible, sit up straight or lean slightly forward. Try not to slouch.  Hold the incentive spirometer in an upright  position. INSTRUCTIONS FOR USE  1. Sit on the edge of your bed if possible, or sit up as far as you can in bed or on a chair. 2. Hold the incentive spirometer in an upright position. 3. Breathe out normally. 4. Place the mouthpiece in your mouth  and seal your lips tightly around it. 5. Breathe in slowly and as deeply as possible, raising the piston or the ball toward the top of the column. 6. Hold your breath for 3-5 seconds or for as long as possible. Allow the piston or ball to fall to the bottom of the column. 7. Remove the mouthpiece from your mouth and breathe out normally. 8. Rest for a few seconds and repeat Steps 1 through 7 at least 10 times every 1-2 hours when you are awake. Take your time and take a few normal breaths between deep breaths. 9. The spirometer may include an indicator to show your best effort. Use the indicator as a goal to work toward during each repetition. 10. After each set of 10 deep breaths, practice coughing to be sure your lungs are clear. If you have an incision (the cut made at the time of surgery), support your incision when coughing by placing a pillow or rolled up towels firmly against it. Once you are able to get out of bed, walk around indoors and cough well. You may stop using the incentive spirometer when instructed by your caregiver.  RISKS AND COMPLICATIONS  Take your time so you do not get dizzy or light-headed.  If you are in pain, you may need to take or ask for pain medication before doing incentive spirometry. It is harder to take a deep breath if you are having pain. AFTER USE  Rest and breathe slowly and easily.  It can be helpful to keep track of a log of your progress. Your caregiver can provide you with a simple table to help with this. If you are using the spirometer at home, follow these instructions: SEEK MEDICAL CARE IF:   You are having difficultly using the spirometer.  You have trouble using the spirometer as often as  instructed.  Your pain medication is not giving enough relief while using the spirometer.  You develop fever of 100.5 F (38.1 C) or higher. SEEK IMMEDIATE MEDICAL CARE IF:   You cough up bloody sputum that had not been present before.  You develop fever of 102 F (38.9 C) or greater.  You develop worsening pain at or near the incision site. MAKE SURE YOU:   Understand these instructions.  Will watch your condition.  Will get help right away if you are not doing well or get worse. Document Released: 02/27/2007 Document Revised: 01/09/2012 Document Reviewed: 04/30/2007 ExitCare Patient Information 2014 ExitCare, [place].   ________________________________________________________________________  WHAT IS A BLOOD TRANSFUSION? Blood Transfusion Information  A transfusion is the replacement of blood or some of its parts. Blood is made up of multiple cells which provide different functions.  Red blood cells carry oxygen and are used for blood loss replacement.  White blood cells fight against infection.  Platelets control bleeding.  Plasma helps clot blood.  Other blood products are available for specialized needs, such as hemophilia or other clotting disorders. BEFORE THE TRANSFUSION  Who gives blood for transfusions?   Healthy volunteers who are fully evaluated to make sure their blood is safe. This is blood bank blood. Transfusion therapy is the safest it has ever been in the practice of medicine. Before blood is taken from a donor, a complete history is taken to make sure that person has no history of diseases nor engages in risky social behavior (examples are intravenous drug use or sexual activity with multiple partners). The donor's travel history is screened to minimize risk of transmitting infections,  such as malaria. The donated blood is tested for signs of infectious diseases, such as HIV and hepatitis. The blood is then tested to be sure it is compatible with you in  order to minimize the chance of a transfusion reaction. If you or a relative donates blood, this is often done in anticipation of surgery and is not appropriate for emergency situations. It takes many days to process the donated blood. RISKS AND COMPLICATIONS Although transfusion therapy is very safe and saves many lives, the main dangers of transfusion include:   Getting an infectious disease.  Developing a transfusion reaction. This is an allergic reaction to something in the blood you were given. Every precaution is taken to prevent this. The decision to have a blood transfusion has been considered carefully by your caregiver before blood is given. Blood is not given unless the benefits outweigh the risks. AFTER THE TRANSFUSION  Right after receiving a blood transfusion, you will usually feel much better and more energetic. This is especially true if your red blood cells have gotten low (anemic). The transfusion raises the level of the red blood cells which carry oxygen, and this usually causes an energy increase.  The nurse administering the transfusion will monitor you carefully for complications. HOME CARE INSTRUCTIONS  No special instructions are needed after a transfusion. You may find your energy is better. Speak with your caregiver about any limitations on activity for underlying diseases you may have. SEEK MEDICAL CARE IF:   Your condition is not improving after your transfusion.  You develop redness or irritation at the intravenous (IV) site. SEEK IMMEDIATE MEDICAL CARE IF:  Any of the following symptoms occur over the next 12 hours:  Shaking chills.  You have a temperature by mouth above 102 F (38.9 C), not controlled by medicine.  Chest, back, or muscle pain.  People around you feel you are not acting correctly or are confused.  Shortness of breath or difficulty breathing.  Dizziness and fainting.  You get a rash or develop hives.  You have a decrease in urine  output.  Your urine turns a dark color or changes to pink, red, or brown. Any of the following symptoms occur over the next 10 days:  You have a temperature by mouth above 102 F (38.9 C), not controlled by medicine.  Shortness of breath.  Weakness after normal activity.  The white part of the eye turns yellow (jaundice).  You have a decrease in the amount of urine or are urinating less often.  Your urine turns a dark color or changes to pink, red, or brown. Document Released: 10/14/2000 Document Revised: 01/09/2012 Document Reviewed: 06/02/2008 [facility] Patient Information 2014 [place], [place].  _______________________________________________________________________

## 2020-06-29 ENCOUNTER — Other Ambulatory Visit: Payer: Self-pay

## 2020-06-29 ENCOUNTER — Other Ambulatory Visit (HOSPITAL_COMMUNITY)
Admission: RE | Admit: 2020-06-29 | Discharge: 2020-06-29 | Disposition: A | Discharge status: Home / Self Care | Payer: No Typology Code available for payment source | Source: Ambulatory Visit | Attending: Orthopedic Surgery | Admitting: Orthopedic Surgery

## 2020-06-29 ENCOUNTER — Encounter (HOSPITAL_COMMUNITY): Payer: Self-pay

## 2020-06-29 ENCOUNTER — Encounter (HOSPITAL_COMMUNITY)
Admission: RE | Admit: 2020-06-29 | Discharge: 2020-06-29 | Disposition: A | Discharge status: Home / Self Care | Payer: No Typology Code available for payment source | Source: Ambulatory Visit | Attending: Orthopedic Surgery | Admitting: Orthopedic Surgery

## 2020-06-29 DIAGNOSIS — Z01818 Encounter for other preprocedural examination: Secondary | ICD-10-CM | POA: Diagnosis not present

## 2020-06-29 DIAGNOSIS — Z20822 Contact with and (suspected) exposure to covid-19: Secondary | ICD-10-CM | POA: Insufficient documentation

## 2020-06-29 HISTORY — DX: Gastro-esophageal reflux disease without esophagitis: K21.9

## 2020-06-29 HISTORY — DX: Sleep apnea, unspecified: G47.30

## 2020-06-29 HISTORY — DX: Cardiac murmur, unspecified: R01.1

## 2020-06-29 LAB — URINALYSIS, ROUTINE W REFLEX MICROSCOPIC
Bacteria, UA: NONE SEEN
Bilirubin Urine: NEGATIVE
Glucose, UA: NEGATIVE mg/dL
Ketones, ur: NEGATIVE mg/dL
Leukocytes,Ua: NEGATIVE
Nitrite: NEGATIVE
Protein, ur: 30 mg/dL — AB
Specific Gravity, Urine: 1.019 (ref 1.005–1.030)
pH: 5 (ref 5.0–8.0)

## 2020-06-29 LAB — COMPREHENSIVE METABOLIC PANEL
ALT: 22 U/L (ref 0–44)
AST: 21 U/L (ref 15–41)
Albumin: 3.9 g/dL (ref 3.5–5.0)
Alkaline Phosphatase: 99 U/L (ref 38–126)
Anion gap: 9 (ref 5–15)
BUN: 22 mg/dL (ref 8–23)
CO2: 27 mmol/L (ref 22–32)
Calcium: 9.4 mg/dL (ref 8.9–10.3)
Chloride: 103 mmol/L (ref 98–111)
Creatinine, Ser: 1.52 mg/dL — ABNORMAL HIGH (ref 0.61–1.24)
GFR calc Af Amer: 53 mL/min — ABNORMAL LOW (ref >60)
GFR calc non Af Amer: 46 mL/min — ABNORMAL LOW (ref >60)
Glucose, Bld: 222 mg/dL — ABNORMAL HIGH (ref 70–99)
Potassium: 4.3 mmol/L (ref 3.5–5.1)
Sodium: 139 mmol/L (ref 135–145)
Total Bilirubin: 0.6 mg/dL (ref 0.3–1.2)
Total Protein: 7.2 g/dL (ref 6.5–8.1)

## 2020-06-29 LAB — CBC
HCT: 45.2 % (ref 39.0–52.0)
Hemoglobin: 14.6 g/dL (ref 13.0–17.0)
MCH: 28.6 pg (ref 26.0–34.0)
MCHC: 32.3 g/dL (ref 30.0–36.0)
MCV: 88.6 fL (ref 80.0–100.0)
Platelets: 244 10*3/uL (ref 150–400)
RBC: 5.1 MIL/uL (ref 4.22–5.81)
RDW: 13.1 % (ref 11.5–15.5)
WBC: 5.8 10*3/uL (ref 4.0–10.5)
nRBC: 0 % (ref 0.0–0.2)

## 2020-06-29 LAB — HEMOGLOBIN A1C
Hgb A1c MFr Bld: 7.3 % — ABNORMAL HIGH (ref 4.8–5.6)
Mean Plasma Glucose: 162.81 mg/dL

## 2020-06-29 LAB — PROTIME-INR
INR: 0.9 (ref 0.8–1.2)
Prothrombin Time: 11.9 seconds (ref 11.4–15.2)

## 2020-06-29 LAB — GLUCOSE, CAPILLARY: Glucose-Capillary: 199 mg/dL — ABNORMAL HIGH (ref 70–99)

## 2020-06-29 LAB — SURGICAL PCR SCREEN
MRSA, PCR: NEGATIVE
Staphylococcus aureus: NEGATIVE

## 2020-06-29 LAB — SARS CORONAVIRUS 2 (TAT 6-24 HRS): SARS Coronavirus 2: NEGATIVE

## 2020-06-29 NOTE — Progress Notes (Addendum)
COVID Vaccine Completed: x2 Date COVID Vaccine completed: 12-05-19 & 01-02-20  COVID vaccine manufacturer: Pfizer    Moderna   [employer]   PCP - Kathryne Sharper, [place] Cardiologist -   Chest x-ray -  EKG - at [place], [place] 04/2020.  On Chart Stress Test -  ECHO -  Cardiac Cath -   Sleep Study - 3+ years ago CPAP - Yes  Fasting Blood Sugar -  Checks Blood Sugar -  does not check  Blood Thinner Instructions: Aspirin Instructions:   Last Dose:  Anesthesia review: Sleep Apnea  Patient denies shortness of breath, fever, cough and chest pain at PAT appointment   Patient verbalized understanding of instructions that were given to them at the PAT appointment. Patient was also instructed that they will need to review over the PAT instructions again at home before surgery.

## 2020-06-29 NOTE — Progress Notes (Signed)
CMP sent to Dr. Swinteck to review. 

## 2020-06-30 NOTE — Progress Notes (Signed)
A1c sent to Dr. Linna Caprice to review

## 2020-07-02 ENCOUNTER — Encounter (HOSPITAL_COMMUNITY)
Admission: RE | Disposition: A | Discharge status: Home / Self Care | Payer: Self-pay | Source: Home / Self Care | Attending: Orthopedic Surgery

## 2020-07-02 ENCOUNTER — Ambulatory Visit (HOSPITAL_COMMUNITY): Payer: No Typology Code available for payment source | Admitting: Certified Registered Nurse Anesthetist

## 2020-07-02 ENCOUNTER — Ambulatory Visit (HOSPITAL_COMMUNITY): Payer: No Typology Code available for payment source | Admitting: Physician Assistant

## 2020-07-02 ENCOUNTER — Ambulatory Visit (HOSPITAL_COMMUNITY): Payer: No Typology Code available for payment source

## 2020-07-02 ENCOUNTER — Encounter (HOSPITAL_COMMUNITY): Payer: Self-pay | Admitting: Orthopedic Surgery

## 2020-07-02 ENCOUNTER — Ambulatory Visit (HOSPITAL_COMMUNITY)
Admission: RE | Admit: 2020-07-02 | Discharge: 2020-07-02 | Disposition: A | Discharge status: Home / Self Care | Payer: No Typology Code available for payment source | Attending: Orthopedic Surgery | Admitting: Orthopedic Surgery

## 2020-07-02 DIAGNOSIS — I10 Essential (primary) hypertension: Secondary | ICD-10-CM | POA: Diagnosis not present

## 2020-07-02 DIAGNOSIS — Z794 Long term (current) use of insulin: Secondary | ICD-10-CM | POA: Insufficient documentation

## 2020-07-02 DIAGNOSIS — E785 Hyperlipidemia, unspecified: Secondary | ICD-10-CM | POA: Diagnosis not present

## 2020-07-02 DIAGNOSIS — G4730 Sleep apnea, unspecified: Secondary | ICD-10-CM | POA: Insufficient documentation

## 2020-07-02 DIAGNOSIS — Z79899 Other long term (current) drug therapy: Secondary | ICD-10-CM | POA: Insufficient documentation

## 2020-07-02 DIAGNOSIS — M1712 Unilateral primary osteoarthritis, left knee: Secondary | ICD-10-CM | POA: Diagnosis not present

## 2020-07-02 DIAGNOSIS — J45909 Unspecified asthma, uncomplicated: Secondary | ICD-10-CM | POA: Diagnosis not present

## 2020-07-02 DIAGNOSIS — F329 Major depressive disorder, single episode, unspecified: Secondary | ICD-10-CM | POA: Insufficient documentation

## 2020-07-02 DIAGNOSIS — Z87891 Personal history of nicotine dependence: Secondary | ICD-10-CM | POA: Insufficient documentation

## 2020-07-02 DIAGNOSIS — E119 Type 2 diabetes mellitus without complications: Secondary | ICD-10-CM | POA: Diagnosis not present

## 2020-07-02 DIAGNOSIS — H409 Unspecified glaucoma: Secondary | ICD-10-CM | POA: Insufficient documentation

## 2020-07-02 DIAGNOSIS — Z791 Long term (current) use of non-steroidal anti-inflammatories (NSAID): Secondary | ICD-10-CM | POA: Diagnosis not present

## 2020-07-02 DIAGNOSIS — Z96652 Presence of left artificial knee joint: Secondary | ICD-10-CM | POA: Diagnosis not present

## 2020-07-02 HISTORY — PX: KNEE ARTHROPLASTY: SHX992

## 2020-07-02 LAB — BASIC METABOLIC PANEL
Anion gap: 6 (ref 5–15)
BUN: 20 mg/dL (ref 8–23)
CO2: 26 mmol/L (ref 22–32)
Calcium: 8.4 mg/dL — ABNORMAL LOW (ref 8.9–10.3)
Chloride: 105 mmol/L (ref 98–111)
Creatinine, Ser: 1.37 mg/dL — ABNORMAL HIGH (ref 0.61–1.24)
GFR calc Af Amer: >60 mL/min (ref >60)
GFR calc non Af Amer: 52 mL/min — ABNORMAL LOW (ref >60)
Glucose, Bld: 172 mg/dL — ABNORMAL HIGH (ref 70–99)
Potassium: 4.4 mmol/L (ref 3.5–5.1)
Sodium: 137 mmol/L (ref 135–145)

## 2020-07-02 LAB — TYPE AND SCREEN
ABO/RH(D): O POS
Antibody Screen: NEGATIVE

## 2020-07-02 LAB — GLUCOSE, CAPILLARY
Glucose-Capillary: 134 mg/dL — ABNORMAL HIGH (ref 70–99)
Glucose-Capillary: 152 mg/dL — ABNORMAL HIGH (ref 70–99)

## 2020-07-02 SURGERY — ARTHROPLASTY, KNEE, TOTAL, USING IMAGELESS COMPUTER-ASSISTED NAVIGATION
Anesthesia: Spinal | Site: Knee | Laterality: Left

## 2020-07-02 MED ORDER — TRANEXAMIC ACID-NACL 1000-0.7 MG/100ML-% IV SOLN: 1000.0000 mg | Freq: Once | INTRAVENOUS | Status: AC

## 2020-07-02 MED ORDER — OXYCODONE HCL ER 10 MG PO T12A: 10.0000 mg | EXTENDED_RELEASE_TABLET | ORAL | 0 refills | Status: DC

## 2020-07-02 MED ORDER — CEFAZOLIN SODIUM-DEXTROSE 2-4 GM/100ML-% IV SOLN
INTRAVENOUS | Status: AC
  Administered 2020-07-02: 2 g via INTRAVENOUS
  Filled 2020-07-02: qty 100

## 2020-07-02 MED ORDER — TRANEXAMIC ACID-NACL 1000-0.7 MG/100ML-% IV SOLN
1000.0000 mg | INTRAVENOUS | Status: AC
  Administered 2020-07-02: 1000 mg via INTRAVENOUS
  Filled 2020-07-02: qty 100

## 2020-07-02 MED ORDER — POVIDONE-IODINE 10 % EX SWAB: 2.0000 "application " | Freq: Once | CUTANEOUS | Status: DC

## 2020-07-02 MED ORDER — ONDANSETRON HCL 4 MG/2ML IJ SOLN: 4.0000 mg | Freq: Four times a day (QID) | INTRAMUSCULAR | Status: DC | PRN

## 2020-07-02 MED ORDER — STERILE WATER FOR IRRIGATION IR SOLN
Status: DC | PRN
  Administered 2020-07-02: 2000 mL

## 2020-07-02 MED ORDER — ONDANSETRON HCL 4 MG PO TABS: 4.0000 mg | ORAL_TABLET | Freq: Three times a day (TID) | ORAL | 0 refills | Status: DC | PRN

## 2020-07-02 MED ORDER — BUPIVACAINE HCL (PF) 0.25 % IJ SOLN
INTRAMUSCULAR | Status: DC | PRN
  Administered 2020-07-02: 30 mL

## 2020-07-02 MED ORDER — KETOROLAC TROMETHAMINE 15 MG/ML IJ SOLN: 7.5000 mg | Freq: Four times a day (QID) | INTRAMUSCULAR | Status: DC

## 2020-07-02 MED ORDER — ISOPROPYL ALCOHOL 70 % SOLN
Status: DC | PRN
  Administered 2020-07-02: 1 via TOPICAL

## 2020-07-02 MED ORDER — SENNA 8.6 MG PO TABS: 2.0000 | ORAL_TABLET | Freq: Every day | ORAL | 1 refills | Status: DC

## 2020-07-02 MED ORDER — BUPIVACAINE HCL 0.25 % IJ SOLN
INTRAMUSCULAR | Status: AC
  Filled 2020-07-02: qty 1

## 2020-07-02 MED ORDER — TRANEXAMIC ACID-NACL 1000-0.7 MG/100ML-% IV SOLN
INTRAVENOUS | Status: AC
  Administered 2020-07-02: 1000 mg via INTRAVENOUS
  Filled 2020-07-02: qty 100

## 2020-07-02 MED ORDER — OXYCODONE HCL ER 10 MG PO T12A
10.0000 mg | EXTENDED_RELEASE_TABLET | ORAL | 0 refills | Status: AC
Start: 2020-07-02 — End: ?

## 2020-07-02 MED ORDER — HYDROMORPHONE HCL 1 MG/ML IJ SOLN: 0.5000 mg | INTRAMUSCULAR | Status: DC | PRN

## 2020-07-02 MED ORDER — PROPOFOL 500 MG/50ML IV EMUL
INTRAVENOUS | Status: DC | PRN
  Administered 2020-07-02: 75 ug/kg/min via INTRAVENOUS

## 2020-07-02 MED ORDER — SODIUM CHLORIDE (PF) 0.9 % IJ SOLN
INTRAMUSCULAR | Status: AC
  Filled 2020-07-02: qty 50

## 2020-07-02 MED ORDER — ACETAMINOPHEN 10 MG/ML IV SOLN
1000.0000 mg | Freq: Once | INTRAVENOUS | Status: AC
  Administered 2020-07-02: 1000 mg via INTRAVENOUS
  Filled 2020-07-02: qty 100

## 2020-07-02 MED ORDER — METOCLOPRAMIDE HCL 5 MG PO TABS
5.0000 mg | ORAL_TABLET | Freq: Three times a day (TID) | ORAL | Status: DC | PRN
  Filled 2020-07-02: qty 2

## 2020-07-02 MED ORDER — BUPIVACAINE IN DEXTROSE 0.75-8.25 % IT SOLN
INTRATHECAL | Status: DC | PRN
  Administered 2020-07-02: 1.6 mL via INTRATHECAL

## 2020-07-02 MED ORDER — PHENYLEPHRINE 40 MCG/ML (10ML) SYRINGE FOR IV PUSH (FOR BLOOD PRESSURE SUPPORT): PREFILLED_SYRINGE | INTRAVENOUS | Status: DC | PRN

## 2020-07-02 MED ORDER — OXYCODONE HCL 5 MG PO TABS: 10.0000 mg | ORAL_TABLET | ORAL | Status: DC | PRN

## 2020-07-02 MED ORDER — METHOCARBAMOL 500 MG PO TABS: 500.0000 mg | ORAL_TABLET | Freq: Four times a day (QID) | ORAL | Status: DC | PRN

## 2020-07-02 MED ORDER — CEFAZOLIN SODIUM-DEXTROSE 2-4 GM/100ML-% IV SOLN
2.0000 g | INTRAVENOUS | Status: AC
  Administered 2020-07-02: 2 g via INTRAVENOUS
  Filled 2020-07-02: qty 100

## 2020-07-02 MED ORDER — FENTANYL CITRATE (PF) 100 MCG/2ML IJ SOLN
50.0000 ug | Freq: Once | INTRAMUSCULAR | Status: AC
  Administered 2020-07-02: 50 ug via INTRAVENOUS
  Filled 2020-07-02: qty 2

## 2020-07-02 MED ORDER — OXYCODONE HCL 5 MG PO TABS
ORAL_TABLET | ORAL | Status: AC
  Administered 2020-07-02: 5 mg via ORAL
  Filled 2020-07-02: qty 1

## 2020-07-02 MED ORDER — LACTATED RINGERS IV BOLUS
250.0000 mL | Freq: Once | INTRAVENOUS | Status: AC
  Administered 2020-07-02 (×2): 250 mL via INTRAVENOUS

## 2020-07-02 MED ORDER — KETOROLAC TROMETHAMINE 30 MG/ML IJ SOLN
INTRAMUSCULAR | Status: DC | PRN
  Administered 2020-07-02: 30 mg

## 2020-07-02 MED ORDER — BUPIVACAINE HCL (PF) 0.5 % IJ SOLN
INTRAMUSCULAR | Status: DC | PRN
  Administered 2020-07-02: 20 mL via PERINEURAL

## 2020-07-02 MED ORDER — ACETAMINOPHEN 325 MG PO TABS: 325.0000 mg | ORAL_TABLET | Freq: Four times a day (QID) | ORAL | Status: DC | PRN

## 2020-07-02 MED ORDER — PROPOFOL 1000 MG/100ML IV EMUL
INTRAVENOUS | Status: AC
  Filled 2020-07-02: qty 100

## 2020-07-02 MED ORDER — DOCUSATE SODIUM 100 MG PO CAPS: 100.0000 mg | ORAL_CAPSULE | Freq: Two times a day (BID) | ORAL | 1 refills | Status: AC

## 2020-07-02 MED ORDER — CHLORHEXIDINE GLUCONATE 0.12 % MT SOLN
15.0000 mL | Freq: Once | OROMUCOSAL | Status: AC
  Administered 2020-07-02: 15 mL via OROMUCOSAL

## 2020-07-02 MED ORDER — HYDROMORPHONE HCL 1 MG/ML IJ SOLN
INTRAMUSCULAR | Status: AC
  Filled 2020-07-02: qty 1

## 2020-07-02 MED ORDER — SENNA 8.6 MG PO TABS: 2.0000 | ORAL_TABLET | Freq: Every day | ORAL | 1 refills | Status: AC

## 2020-07-02 MED ORDER — DOCUSATE SODIUM 100 MG PO CAPS: 100.0000 mg | ORAL_CAPSULE | Freq: Two times a day (BID) | ORAL | 1 refills | Status: DC

## 2020-07-02 MED ORDER — OXYCODONE HCL 5 MG PO TABS: 5.0000 mg | ORAL_TABLET | ORAL | 0 refills | Status: AC | PRN

## 2020-07-02 MED ORDER — ONDANSETRON HCL 4 MG/2ML IJ SOLN
INTRAMUSCULAR | Status: DC | PRN
  Administered 2020-07-02: 4 mg via INTRAVENOUS

## 2020-07-02 MED ORDER — IRRISEPT - 450ML BOTTLE WITH 0.05% CHG IN STERILE WATER, USP 99.95% OPTIME
TOPICAL | Status: DC | PRN
  Administered 2020-07-02: 450 mL

## 2020-07-02 MED ORDER — METHOCARBAMOL 500 MG PO TABS
ORAL_TABLET | ORAL | Status: AC
  Administered 2020-07-02: 500 mg via ORAL
  Filled 2020-07-02: qty 1

## 2020-07-02 MED ORDER — OXYCODONE HCL 5 MG PO TABS: 5.0000 mg | ORAL_TABLET | ORAL | 0 refills | Status: DC | PRN

## 2020-07-02 MED ORDER — METOCLOPRAMIDE HCL 5 MG/ML IJ SOLN: 5.0000 mg | Freq: Three times a day (TID) | INTRAMUSCULAR | Status: DC | PRN

## 2020-07-02 MED ORDER — ONDANSETRON HCL 4 MG PO TABS
4.0000 mg | ORAL_TABLET | Freq: Four times a day (QID) | ORAL | Status: DC | PRN
  Filled 2020-07-02: qty 1

## 2020-07-02 MED ORDER — MIDAZOLAM HCL 2 MG/2ML IJ SOLN
1.0000 mg | INTRAMUSCULAR | Status: AC
  Administered 2020-07-02: 1 mg via INTRAVENOUS
  Filled 2020-07-02: qty 2

## 2020-07-02 MED ORDER — KETOROLAC TROMETHAMINE 30 MG/ML IJ SOLN
INTRAMUSCULAR | Status: AC
  Filled 2020-07-02: qty 1

## 2020-07-02 MED ORDER — ASPIRIN 81 MG PO CHEW: 81.0000 mg | CHEWABLE_TABLET | Freq: Two times a day (BID) | ORAL | 0 refills | Status: DC

## 2020-07-02 MED ORDER — ONDANSETRON HCL 4 MG/2ML IJ SOLN
INTRAMUSCULAR | Status: AC
  Filled 2020-07-02: qty 2

## 2020-07-02 MED ORDER — METHOCARBAMOL 500 MG IVPB - SIMPLE MED: 500.0000 mg | Freq: Four times a day (QID) | INTRAVENOUS | Status: DC | PRN

## 2020-07-02 MED ORDER — SODIUM CHLORIDE (PF) 0.9 % IJ SOLN
INTRAMUSCULAR | Status: DC | PRN
  Administered 2020-07-02: 30 mL

## 2020-07-02 MED ORDER — LACTATED RINGERS IV BOLUS
500.0000 mL | Freq: Once | INTRAVENOUS | Status: AC
  Administered 2020-07-02: 500 mL via INTRAVENOUS

## 2020-07-02 MED ORDER — SODIUM CHLORIDE 0.9 % IV SOLN: INTRAVENOUS | Status: DC

## 2020-07-02 MED ORDER — ISOPROPYL ALCOHOL 70 % SOLN
Status: AC
  Filled 2020-07-02: qty 480

## 2020-07-02 MED ORDER — POVIDONE-IODINE 10 % EX SWAB
2.0000 "application " | Freq: Once | CUTANEOUS | Status: AC
  Administered 2020-07-02: 2 via TOPICAL

## 2020-07-02 MED ORDER — OXYCODONE HCL 5 MG PO TABS: 5.0000 mg | ORAL_TABLET | ORAL | Status: DC | PRN

## 2020-07-02 MED ORDER — HYDROMORPHONE HCL 1 MG/ML IJ SOLN
0.2500 mg | INTRAMUSCULAR | Status: DC | PRN
  Administered 2020-07-02: 0.5 mg via INTRAVENOUS

## 2020-07-02 MED ORDER — CEFAZOLIN SODIUM-DEXTROSE 2-4 GM/100ML-% IV SOLN: 2.0000 g | Freq: Four times a day (QID) | INTRAVENOUS | Status: DC

## 2020-07-02 MED ORDER — LACTATED RINGERS IV SOLN: INTRAVENOUS | Status: DC

## 2020-07-02 MED ORDER — SODIUM CHLORIDE 0.9 % IR SOLN
Status: DC | PRN
  Administered 2020-07-02: 4000 mL

## 2020-07-02 MED ORDER — ORAL CARE MOUTH RINSE: 15.0000 mL | Freq: Once | OROMUCOSAL | Status: AC

## 2020-07-02 MED ORDER — ASPIRIN 81 MG PO CHEW: 81.0000 mg | CHEWABLE_TABLET | Freq: Two times a day (BID) | ORAL | 0 refills | Status: AC

## 2020-07-02 MED ORDER — ONDANSETRON HCL 4 MG PO TABS: 4.0000 mg | ORAL_TABLET | Freq: Three times a day (TID) | ORAL | 0 refills | Status: AC | PRN

## 2020-07-02 MED ORDER — PHENYLEPHRINE HCL-NACL 10-0.9 MG/250ML-% IV SOLN
INTRAVENOUS | Status: DC | PRN
  Administered 2020-07-02: 35 ug/min via INTRAVENOUS

## 2020-07-02 SURGICAL SUPPLY — 76 items
ADH SKN CLS APL DERMABOND .7 (GAUZE/BANDAGES/DRESSINGS) ×1
APL PRP STRL LF DISP 70% ISPRP (MISCELLANEOUS) ×2
BAG SPEC THK2 15X12 ZIP CLS (MISCELLANEOUS)
BAG ZIPLOCK 12X15 (MISCELLANEOUS) IMPLANT
BATTERY INSTRU NAVIGATION (MISCELLANEOUS) ×9 IMPLANT
BLADE SAW RECIPROCATING 77.5 (BLADE) ×3 IMPLANT
BNDG ELASTIC 4X5.8 VLCR STR LF (GAUZE/BANDAGES/DRESSINGS) ×3 IMPLANT
BNDG ELASTIC 6X5.8 VLCR STR LF (GAUZE/BANDAGES/DRESSINGS) ×3 IMPLANT
BSPLAT TIB 5 KN TRITANIUM (Knees) ×1 IMPLANT
BTRY SRG DRVR LF (MISCELLANEOUS) ×3
CHLORAPREP W/TINT 26 (MISCELLANEOUS) ×6 IMPLANT
COMP FEM SZ5 CRUC LEFT RETAIN (Orthopedic Implant) ×3 IMPLANT
COMPONENT FEM SZ5 CRU LT RETN (Orthopedic Implant) ×1 IMPLANT
COVER SURGICAL LIGHT HANDLE (MISCELLANEOUS) ×3 IMPLANT
COVER WAND RF STERILE (DRAPES) IMPLANT
CUFF TOURN SGL QUICK 34 (TOURNIQUET CUFF) ×3
CUFF TRNQT CYL 34X4.125X (TOURNIQUET CUFF) ×1 IMPLANT
DECANTER SPIKE VIAL GLASS SM (MISCELLANEOUS) ×6 IMPLANT
DERMABOND ADVANCED (GAUZE/BANDAGES/DRESSINGS) ×2
DERMABOND ADVANCED .7 DNX12 (GAUZE/BANDAGES/DRESSINGS) ×1 IMPLANT
DRAPE SHEET LG 3/4 BI-LAMINATE (DRAPES) ×9 IMPLANT
DRAPE U-SHAPE 47X51 STRL (DRAPES) ×3 IMPLANT
DRSG AQUACEL AG ADV 3.5X10 (GAUZE/BANDAGES/DRESSINGS) ×3 IMPLANT
DRSG TEGADERM 4X4.75 (GAUZE/BANDAGES/DRESSINGS) IMPLANT
ELECT BLADE TIP CTD 4 INCH (ELECTRODE) ×3 IMPLANT
ELECT REM PT RETURN 15FT ADLT (MISCELLANEOUS) ×3 IMPLANT
EVACUATOR 1/8 PVC DRAIN (DRAIN) IMPLANT
GAUZE SPONGE 4X4 12PLY STRL (GAUZE/BANDAGES/DRESSINGS) ×3 IMPLANT
GLOVE BIO SURGEON STRL SZ8.5 (GLOVE) ×6 IMPLANT
GLOVE BIOGEL M STRL SZ7.5 (GLOVE) ×9 IMPLANT
GLOVE BIOGEL PI IND STRL 8 (GLOVE) ×2 IMPLANT
GLOVE BIOGEL PI IND STRL 8.5 (GLOVE) ×1 IMPLANT
GLOVE BIOGEL PI INDICATOR 8 (GLOVE) ×4
GLOVE BIOGEL PI INDICATOR 8.5 (GLOVE) ×2
GOWN SPEC L3 XXLG W/TWL (GOWN DISPOSABLE) ×3 IMPLANT
GOWN SPEC L4 XLG W/TWL (GOWN DISPOSABLE) ×3 IMPLANT
HANDPIECE INTERPULSE COAX TIP (DISPOSABLE) ×3
HOLDER FOLEY CATH W/STRAP (MISCELLANEOUS) ×3 IMPLANT
HOOD PEEL AWAY FLYTE STAYCOOL (MISCELLANEOUS) ×9 IMPLANT
INSERT TIB TRIATH CR X3 (Insert) ×3 IMPLANT
JET LAVAGE IRRISEPT WOUND (IRRIGATION / IRRIGATOR) ×3
KIT TURNOVER KIT A (KITS) IMPLANT
KNEE PATELLA ASYMMETRIC 10X35 (Knees) ×3 IMPLANT
KNEE TIBIAL COMPONENT SZ5 (Knees) ×3 IMPLANT
LAVAGE JET IRRISEPT WOUND (IRRIGATION / IRRIGATOR) ×1 IMPLANT
MARKER SKIN DUAL TIP RULER LAB (MISCELLANEOUS) ×3 IMPLANT
NDL SAFETY ECLIPSE 18X1.5 (NEEDLE) ×1 IMPLANT
NEEDLE HYPO 18GX1.5 SHARP (NEEDLE) ×3
NEEDLE SPNL 18GX3.5 QUINCKE PK (NEEDLE) ×3 IMPLANT
NS IRRIG 1000ML POUR BTL (IV SOLUTION) ×3 IMPLANT
PACK TOTAL KNEE CUSTOM (KITS) ×3 IMPLANT
PADDING CAST COTTON 6X4 STRL (CAST SUPPLIES) ×3 IMPLANT
PENCIL SMOKE EVACUATOR (MISCELLANEOUS) IMPLANT
PIN FLUTED HEDLESS FIX 3.5X1/8 (PIN) ×3 IMPLANT
PROTECTOR NERVE ULNAR (MISCELLANEOUS) ×3 IMPLANT
SAW OSC TIP CART 19.5X105X1.3 (SAW) ×3 IMPLANT
SEALER BIPOLAR AQUA 6.0 (INSTRUMENTS) ×3 IMPLANT
SET HNDPC FAN SPRY TIP SCT (DISPOSABLE) ×1 IMPLANT
SET PAD KNEE POSITIONER (MISCELLANEOUS) ×3 IMPLANT
SPONGE DRAIN TRACH 4X4 STRL 2S (GAUZE/BANDAGES/DRESSINGS) IMPLANT
SUT MNCRL AB 3-0 PS2 18 (SUTURE) ×3 IMPLANT
SUT MNCRL AB 4-0 PS2 18 (SUTURE) ×3 IMPLANT
SUT MON AB 2-0 CT1 36 (SUTURE) ×3 IMPLANT
SUT STRATAFIX PDO 1 14 VIOLET (SUTURE) ×3
SUT STRATFX PDO 1 14 VIOLET (SUTURE) ×1
SUT VIC AB 1 CTX 36 (SUTURE) ×6
SUT VIC AB 1 CTX36XBRD ANBCTR (SUTURE) ×2 IMPLANT
SUT VIC AB 2-0 CT1 27 (SUTURE) ×3
SUT VIC AB 2-0 CT1 TAPERPNT 27 (SUTURE) ×1 IMPLANT
SUTURE STRATFX PDO 1 14 VIOLET (SUTURE) ×1 IMPLANT
SYR 3ML LL SCALE MARK (SYRINGE) ×3 IMPLANT
TOWER CARTRIDGE SMART MIX (DISPOSABLE) IMPLANT
TRAY FOLEY MTR SLVR 16FR STAT (SET/KITS/TRAYS/PACK) IMPLANT
TUBE SUCTION HIGH CAP CLEAR NV (SUCTIONS) ×3 IMPLANT
WATER STERILE IRR 1000ML POUR (IV SOLUTION) ×6 IMPLANT
WRAP KNEE MAXI GEL POST OP (GAUZE/BANDAGES/DRESSINGS) ×3 IMPLANT

## 2020-07-02 NOTE — Progress Notes (Addendum)
Physical Therapy Treatment Patient Details Name: George Monroe. MRN: [id number] DOB: [birth year] Today's Date: 07/02/2020    History of Present Illness Patient is 69 y.o. male s/p Lt TKA on 07/02/20 with PMH significant for OA, back pain, DM, asthma, depression, anxiety, HTN, HLD, Rt TKA in 2015.    PT Comments    Patient seen for additional therapy session to address stair mobility and HEP as well as improve functional transfers. Patient was able to ambulate 65 feet with RW and supervision and cues for safe walker management. Patient educated on safe sequencing for stair mobility and verbalized safe guarding position for people assisting with mobility. Patient's wife present and participated in stair mobility, she provided safe guarding and cuing for patient. Knee immobilizer used for stair mobility and patient/spouse educated that it is only for use tonight to ascend his stairs to bedroom. Patient/spouse verbalized understanding that he does not need to wear it to bed or tomorrow. Patient instructed in exercises to facilitate ROM and circulation. Patient will benefit from continued skilled PT interventions to address impairments and progress towards PLOF. Patient has met mobility goals at adequate level for discharge home; will continue to follow if pt continues acute stay to progress towards Mod I goals.     Follow Up Recommendations  Follow surgeon's recommendation for DC plan and follow-up therapies     Equipment Recommendations  Rolling walker with 5" wheels;3in1 (PT)    Recommendations for Other Services       Precautions / Restrictions Precautions Precautions: Fall Restrictions Weight Bearing Restrictions: No Other Position/Activity Restrictions: WBAT    Mobility  Bed Mobility Overal bed mobility: Needs Assistance Bed Mobility: Supine to Sit;Sit to Supine     Supine to sit: Supervision Sit to supine: Supervision;HOB elevated   General bed mobility comments: pt able to  sit up and return to supine without assist. performed 2x for car/bed training.   Transfers Overall transfer level: Needs assistance Equipment used: Rolling walker (2 wheeled) Transfers: Sit to/from Stand Sit to Stand: Supervision         General transfer comment: patient with good carryover for safe hand placement on RW, no assist needed to rise.   Ambulation/Gait Ambulation/Gait assistance: Min guard;Min assist Gait Distance (Feet): 65 Feet Assistive device: Rolling walker (2 wheeled) Gait Pattern/deviations: Step-to pattern;Decreased stride length;Decreased weight shift to left;Decreased stance time - left Gait velocity: decr   General Gait Details: intermittent cues required for safe use of RW to keep on floor and roll, pt's wife providing cues as well as therapist. no overt LOB noted with gait.   Stairs Stairs: Yes Stairs assistance: Min guard;Supervision Stair Management: One rail Right;Step to pattern;Forwards;With crutches Number of Stairs: 9 (3x3) General stair comments: VC's for sequencing steps with one rail and crutch. cues for "up with good, down with bad". knee immobilizer on for safety with steps. PT provded cues on first bout. Pt/wife required additional cues on second bout. Pt/spouse able to complete on third bout with no assist/cues from therapist.    Wheelchair Mobility    Modified Rankin (Stroke Patients Only)       Balance Overall balance assessment: Needs assistance Sitting-balance support: Feet supported Sitting balance-Leahy Scale: Good     Standing balance support: During functional activity;Bilateral upper extremity supported Standing balance-Leahy Scale: Fair           Cognition Arousal/Alertness: Awake/alert Behavior During Therapy: WFL for tasks assessed/performed Overall Cognitive Status: Within Functional Limits for tasks assessed  Exercises Total Joint Exercises Ankle Circles/Pumps: AROM;Both;Supine;10 reps Quad  Sets: AROM;Left;Supine;Other reps (comment) (3) Short Arc Quad: AROM;Left;Supine;Other reps (comment) (2) Heel Slides: AROM;Left;Supine;Other reps (comment) (4) Hip ABduction/ADduction: AROM;Left;Supine;Other reps (comment) (2) Straight Leg Raises: AROM;Left;Supine;Other reps (comment) (2) Long Arc Quad: AROM;Left;Supine;Other reps (comment) (3) Knee Flexion: AROM;Left;Supine;Other reps (comment);AAROM (2)    General Comments        Pertinent Vitals/Pain Pain Assessment: Faces Faces Pain Scale: Hurts even more Pain Location: Lt knee Pain Descriptors / Indicators: Aching;Discomfort;Sore Pain Intervention(s): Limited activity within patient's tolerance;Monitored during session;Repositioned    Home Living Family/patient expects to be discharged to:: Private residence Living Arrangements: Spouse/significant other Available Help at Discharge: Family Type of Home: House Home Access: Stairs to enter Entrance Stairs-Rails: Right;Left Home Layout: Two level;Bed/bath upstairs Home Equipment: None      Prior Function Level of Independence: Independent      Comments: pt works full time as Teacher, early years/pre, he is independent with all mobility and ADL's.   PT Goals (current goals can now be found in the care plan section) Acute Rehab PT Goals Patient Stated Goal: get home and back to independence PT Goal Formulation: With patient Time For Goal Achievement: 07/09/20 Potential to Achieve Goals: Good Progress towards PT goals: Progressing toward goals    Frequency    7X/week      PT Plan Current plan remains appropriate       AM-PAC PT "6 Clicks" Mobility   Outcome Measure  Help needed turning from your back to your side while in a flat bed without using bedrails?: None Help needed moving from lying on your back to sitting on the side of a flat bed without using bedrails?: None Help needed moving to and from a bed to a chair (including a wheelchair)?: A Little Help needed  standing up from a chair using your arms (e.g., wheelchair or bedside chair)?: A Little Help needed to walk in hospital room?: A Little Help needed climbing 3-5 steps with a railing? : A Little 6 Click Score: 20    End of Session Equipment Utilized During Treatment: Gait belt Activity Tolerance: Patient tolerated treatment well Patient left: in bed;with call bell/phone within reach;with family/visitor present Nurse Communication: Mobility status PT Visit Diagnosis: Muscle weakness (generalized) (M62.81);Difficulty in walking, not elsewhere classified (R26.2);Pain Pain - Right/Left: Left Pain - part of body: Knee     Time: 0076-2263 PT Time Calculation (min) (ACUTE ONLY): 28 min  Charges:  $Gait Training: 8-22 mins $Therapeutic Exercise: 8-22 mins                   Verner Mould, DPT Acute Rehabilitation Services  Office [phone] Pager [phone]  07/02/2020 6:50 PM

## 2020-07-02 NOTE — Transfer of Care (Signed)
Immediate Anesthesia Transfer of Care Note  Patient: George Monroe.  Procedure(s) Performed: COMPUTER ASSISTED TOTAL KNEE ARTHROPLASTY (Left Knee)  Patient Location: PACU  Anesthesia Type:Spinal  Level of Consciousness: awake, alert  and oriented  Airway & Oxygen Therapy: Patient Spontanous Breathing and Patient connected to face mask oxygen  Post-op Assessment: Report given to RN and Post -op Vital signs reviewed and stable  Post vital signs: Reviewed and stable  Last Vitals:  Vitals Value Taken Time  BP 124/82 07/02/20 1300  Temp    Pulse 64 07/02/20 1302  Resp 14 07/02/20 1302  SpO2 100 % 07/02/20 1302  Vitals shown include unvalidated device data.  Last Pain:  Vitals:   07/02/20 0829  TempSrc: Oral         Complications: No complications documented.

## 2020-07-02 NOTE — Op Note (Signed)
OPERATIVE REPORT  SURGEON: Rod Can, MD   ASSISTANT: Cherlynn June, PA-C  PREOPERATIVE DIAGNOSIS: Left knee arthritis.   POSTOPERATIVE DIAGNOSIS: Left knee arthritis.   PROCEDURE: Left total knee arthroplasty.   IMPLANTS: Stryker Triathlon CR femur, size 5. Stryker Tritanium tibia, size 5. X3 polyethelyene insert, size 9 mm, CR. 3 button asymmetric patella, size 35 mm.  ANESTHESIA:  MAC, Regional and Spinal  TOURNIQUET TIME: Not utilized.   ESTIMATED BLOOD LOSS:-400 mL    ANTIBIOTICS: 2g Ancef.  DRAINS: None.  COMPLICATIONS: None   CONDITION: PACU - hemodynamically stable.   BRIEF CLINICAL NOTE: George Monroe. is a 69 y.o. male with a long-standing history of Left knee arthritis. After failing conservative management, the patient was indicated for total knee arthroplasty. The risks, benefits, and alternatives to the procedure were explained, and the patient elected to proceed.  PROCEDURE IN DETAIL: Adductor canal block was obtained in the pre-op holding area. Once inside the operative room, spinal anesthesia was obtained, and a foley catheter was inserted. The patient was then positioned, a nonsterile tourniquet was placed, and the lower extremity was prepped and draped in the normal sterile surgical fashion.  A time-out was called verifying side and site of surgery. The patient received IV antibiotics within 60 minutes of beginning the procedure. The tourniquet was not utilized.   An anterior approach to the knee was performed utilizing a midvastus arthrotomy. A medial release was performed and the patellar fat pad was excised. Stryker navigation was used to cut the distal femur perpendicular to the mechanical axis. A freehand patellar resection was performed, and the patella was sized an prepared with 3 lug holes.  Nagivation was used to make a neutral proximal tibia  resection, taking 2 mm of bone from the medial side with 3 degrees of slope. The menisci were excised. A spacer block was placed, and the alignment and balance in extension were confirmed.   The distal femur was sized using the 3-degree external rotation guide referencing the posterior femoral cortex. The appropriate 4-in-1 cutting block was pinned into place. Rotation was checked using Whiteside's line, the epicondylar axis, and then confirmed with a spacer block in flexion. The remaining femoral cuts were performed, taking care to protect the MCL.  The tibia was sized and the trial tray was pinned into place. The remaining trail components were inserted. The knee was stable to varus and valgus stress through a full range of motion. The patella tracked centrally, and the PCL was well balanced. The trial components were removed, and the proximal tibial surface was prepared. Final components were impacted into place. The knee was tested for a final time and found to be well balanced.   The wound was copiously irrigated with Irrisept solution and normal saline using pule lavage.  Marcaine solution was injected into the periarticular soft tissue.  The wound was closed in layers using #1 Vicryl and Stratafix for the fascia, 2-0 Vicryl for the subcutaneous fat, 2-0 Monocryl for the deep dermal layer, 3-0 running Monocryl subcuticular Stitch, and 4-0 Monocryl stay sutures at both ends of the wound. Dermabond was applied to the skin.  Once the glue was fully dried, an Aquacell Ag and compressive dressing were applied.  Tthe patient was transported to the recovery room in stable condition.  Sponge, needle, and instrument counts were correct at the end of the case x2.  The patient tolerated the procedure well and there were no known complications.  Please note that a surgical assistant  was a medical necessity for this procedure in order to perform it in a safe and expeditious manner. Surgical assistant was necessary  to retract the ligaments and vital neurovascular structures to prevent injury to them and also necessary for proper positioning of the limb to allow for anatomic placement of the prosthesis.

## 2020-07-02 NOTE — Progress Notes (Signed)
Patient expresses they do not have a front rolling walker. Wife states they were given a prescription for a 3&1, but have not fulfilled getting one. Dr. Linna Caprice and his PA, Peyton Najjar have been paged for DME orders. [facility] has also been called. No return call given. Patient given front rolling walker and 3&1 in PACU prior to discharge.

## 2020-07-02 NOTE — Discharge Instructions (Signed)
° °Dr. Jamir Rone °Total Joint Specialist °[facility] °[address] °[place], [place] [address] °[phone] ° °TOTAL KNEE REPLACEMENT POSTOPERATIVE DIRECTIONS ° ° ° °Knee Rehabilitation, Guidelines Following Surgery  °Results after knee surgery are often greatly improved when you follow the exercise, range of motion and muscle strengthening exercises prescribed by your doctor. Safety measures are also important to protect the knee from further injury. Any time any of these exercises cause you to have increased pain or swelling in your knee joint, decrease the amount until you are comfortable again and slowly increase them. If you have problems or questions, call your caregiver or physical therapist for advice.  ° °WEIGHT BEARING °Weight bearing as tolerated with assist device (walker, cane, etc) as directed, use it as long as suggested by your surgeon or therapist, typically at least 4-6 weeks. ° °HOME CARE INSTRUCTIONS  °Remove items at home which could result in a fall. This includes throw rugs or furniture in walking pathways.  °Continue medications as instructed at time of discharge. °You may have some home medications which will be placed on hold until you complete the course of blood thinner medication.  °You may start showering once you are discharged home but do not submerge the incision under water. Just pat the incision dry and apply a dry gauze dressing on daily. °Walk with walker as instructed.  °You may resume a sexual relationship in one month or when given the OK by your doctor.  °· Use walker as long as suggested by your caregivers. °· Avoid periods of inactivity such as sitting longer than an hour when not asleep. This helps prevent blood clots.  °You may put full weight on your legs and walk as much as is comfortable.  °You may return to work once you are cleared by your doctor.  °Do not drive a car for 6 weeks or until released by you surgeon.  °· Do not drive  while taking narcotics.  °Wear the elastic stockings for three weeks following surgery during the day but you may remove then at night. °Make sure you keep all of your appointments after your operation with all of your doctors and caregivers. You should call the office at the above phone number and make an appointment for approximately two weeks after the date of your surgery. °Do not remove your surgical dressing. The dressing is waterproof; you may take showers in 3 days, but do not take tub baths or submerge the dressing. °Please pick up a stool softener and laxative for home use as long as you are requiring pain medications. °· ICE to the affected knee every three hours for 30 minutes at a time and then as needed for pain and swelling.  Continue to use ice on the knee for pain and swelling from surgery. You may notice swelling that will progress down to the foot and ankle.  This is normal after surgery.  Elevate the leg when you are not up walking on it.   °It is important for you to complete the blood thinner medication as prescribed by your doctor. °· Continue to use the breathing machine which will help keep your temperature down.  It is common for your temperature to cycle up and down following surgery, especially at night when you are not up moving around and exerting yourself.  The breathing machine keeps your lungs expanded and your temperature down. ° °RANGE OF MOTION AND STRENGTHENING EXERCISES  °Rehabilitation of the knee is important following   a knee injury or an operation. After just a few days of immobilization, the muscles of the thigh which control the knee become weakened and shrink (atrophy). Knee exercises are designed to build up the tone and strength of the thigh muscles and to improve knee motion. Often times heat used for twenty to thirty minutes before working out will loosen up your tissues and help with improving the range of motion but do not use heat for the first two weeks following  surgery. These exercises can be done on a training (exercise) mat, on the floor, on a table or on a bed. Use what ever works the best and is most comfortable for you Knee exercises include:  °Leg Lifts - While your knee is still immobilized in a splint or cast, you can do straight leg raises. Lift the leg to 60 degrees, hold for 3 sec, and slowly lower the leg. Repeat 10-20 times 2-3 times daily. Perform this exercise against resistance later as your knee gets better.  °Quad and Hamstring Sets - Tighten up the muscle on the front of the thigh (Quad) and hold for 5-10 sec. Repeat this 10-20 times hourly. Hamstring sets are done by pushing the foot backward against an object and holding for 5-10 sec. Repeat as with quad sets.  °A rehabilitation program following serious knee injuries can speed recovery and prevent re-injury in the future due to weakened muscles. Contact your doctor or a physical therapist for more information on knee rehabilitation.  ° °SKILLED REHAB INSTRUCTIONS: °If the patient is transferred to a skilled rehab facility following release from the hospital, a list of the current medications will be sent to the facility for the patient to continue.  When discharged from the skilled rehab facility, please have the facility set up the patient's Home Health Physical Therapy prior to being released. Also, the skilled facility will be responsible for providing the patient with their medications at time of release from the facility to include their pain medication, the muscle relaxants, and their blood thinner medication. If the patient is still at the rehab facility at time of the two week follow up appointment, the skilled rehab facility will also need to assist the patient in arranging follow up appointment in our office and any transportation needs. ° °MAKE SURE YOU:  °Understand these instructions.  °Will watch your condition.  °Will get help right away if you are not doing well or get worse.   ° ° °Pick up stool softner and laxative for home use following surgery while on pain medications. °Do NOT remove your dressing. You may shower.  °Do not take tub baths or submerge incision under water. °May shower starting three days after surgery. °Please use a clean towel to pat the incision dry following showers. °Continue to use ice for pain and swelling after surgery. °Do not use any lotions or creams on the incision until instructed by your surgeon. ° °

## 2020-07-02 NOTE — Anesthesia Preprocedure Evaluation (Signed)
Anesthesia Evaluation  Patient identified by MRN, date of birth, ID band Patient awake    Reviewed: Allergy & Precautions, NPO status , Patient's Chart, lab work & pertinent test results  Airway Mallampati: III  TM Distance: >3 FB Neck ROM: Full    Dental no notable dental hx.    Pulmonary sleep apnea , former smoker,    Pulmonary exam normal breath sounds clear to auscultation       Cardiovascular hypertension, Pt. on medications Normal cardiovascular exam Rhythm:Regular Rate:Normal     Neuro/Psych negative neurological ROS  negative psych ROS   GI/Hepatic Neg liver ROS, GERD  ,  Endo/Other  diabetes, Type 2  Renal/GU negative Renal ROS  negative genitourinary   Musculoskeletal negative musculoskeletal ROS (+)   Abdominal   Peds negative pediatric ROS (+)  Hematology negative hematology ROS (+)   Anesthesia Other Findings   Reproductive/Obstetrics negative OB ROS                             Anesthesia Physical Anesthesia Plan  ASA: III  Anesthesia Plan: Spinal   Post-op Pain Management:  Regional for Post-op pain   Induction: Intravenous  PONV Risk Score and Plan: 2 and Ondansetron, Dexamethasone and Treatment may vary due to age or medical condition  Airway Management Planned: Simple Face Mask  Additional Equipment:   Intra-op Plan:   Post-operative Plan:   Informed Consent: I have reviewed the patients History and Physical, chart, labs and discussed the procedure including the risks, benefits and alternatives for the proposed anesthesia with the patient or authorized representative who has indicated his/her understanding and acceptance.     Dental advisory given  Plan Discussed with: CRNA and Surgeon  Anesthesia Plan Comments:         Anesthesia Quick Evaluation

## 2020-07-02 NOTE — Interval H&P Note (Signed)
History and Physical Interval Note:  07/02/2020 9:21 AM  George Monroe.  has presented today for surgery, with the diagnosis of Degenerative joint disease left knee.  The various methods of treatment have been discussed with the patient and family. After consideration of risks, benefits and other options for treatment, the patient has consented to  Procedure(s): COMPUTER ASSISTED TOTAL KNEE ARTHROPLASTY (Left) as a surgical intervention.  The patient's history has been reviewed, patient examined, no change in status, stable for surgery.  I have reviewed the patient's chart and labs.  Questions were answered to the patient's satisfaction.    The risks, benefits, and alternatives were discussed with the patient. There are risks associated with the surgery including, but not limited to, problems with anesthesia (death), infection, differences in leg length/angulation/rotation, fracture of bones, loosening or failure of implants, malunion, nonunion, hematoma (blood accumulation) which may require surgical drainage, blood clots, pulmonary embolism, nerve injury (foot drop), and blood vessel injury. The patient understands these risks and elects to proceed.    Iline Oven Gloris Shiroma

## 2020-07-02 NOTE — Progress Notes (Signed)
Orthopedic Tech Progress Note Patient Details:  George Monroe. 1950-12-19 [id number] Knee immobilizer to be applied by Pt. Ortho Devices Type of Ortho Device: Knee Immobilizer Ortho Device/Splint Location: LLE Ortho Device/Splint Interventions: Ordered       Jennye Moccasin 07/02/2020, 4:38 PM

## 2020-07-02 NOTE — Anesthesia Procedure Notes (Signed)
Spinal  Patient location during procedure: OR End time: 07/02/2020 9:39 AM Staffing Performed: resident/CRNA  Resident/CRNA: Maxwell Caul, CRNA Preanesthetic Checklist Completed: patient identified, IV checked, site marked, risks and benefits discussed, surgical consent, monitors and equipment checked, pre-op evaluation and timeout performed Spinal Block Patient position: sitting Prep: DuraPrep Patient monitoring: heart rate, cardiac monitor, continuous pulse ox and blood pressure Approach: midline Location: L3-4 Injection technique: single-shot Needle Needle type: Pencan  Needle gauge: 24 G Needle length: 10 cm Assessment Sensory level: T4 Additional Notes IV functioning, monitors applied to pt. Expiration date of kit checked and confirmed to be in date. Sterile prep and drape, hand hygiene and sterile gloved used. Pt was positioned and spine was prepped in sterile fashion. Skin was anesthetized with lidocaine. Free flow of clear CSF obtained prior to injecting local anesthetic into CSF x 1 attempt. Spinal needle aspirated freely following injection. Needle was carefully withdrawn, and pt tolerated procedure well. Loss of motor and sensory on exam post injection. Dr Kalman Shan at bedside during entire placement.

## 2020-07-02 NOTE — Anesthesia Procedure Notes (Signed)
Anesthesia Procedure Image    

## 2020-07-02 NOTE — Anesthesia Postprocedure Evaluation (Signed)
Anesthesia Post Note  Patient: George Monroe.  Procedure(s) Performed: COMPUTER ASSISTED TOTAL KNEE ARTHROPLASTY (Left Knee)     Patient location during evaluation: PACU Anesthesia Type: Spinal Level of consciousness: oriented and awake and alert Pain management: pain level controlled Vital Signs Assessment: post-procedure vital signs reviewed and stable Respiratory status: spontaneous breathing, respiratory function stable and patient connected to nasal cannula oxygen Cardiovascular status: blood pressure returned to baseline and stable Postop Assessment: no headache, no backache and no apparent nausea or vomiting Anesthetic complications: no   No complications documented.  Last Vitals:  Vitals:   07/02/20 1445 07/02/20 1500  BP: 138/83 (!) 141/87  Pulse: 71 70  Resp: 14 14  Temp:    SpO2: 100% 97%    Last Pain:  Vitals:   07/02/20 1430  TempSrc:   PainSc: Asleep                 Daijanae Rafalski S

## 2020-07-02 NOTE — Anesthesia Procedure Notes (Signed)
Anesthesia Regional Block: Adductor canal block   Pre-Anesthetic Checklist: ,, timeout performed, Correct Patient, Correct Site, Correct Laterality, Correct Procedure, Correct Position, site marked, Risks and benefits discussed,  Surgical consent,  Pre-op evaluation,  At surgeon's request and post-op pain management  Laterality: Left  Prep: chloraprep       Needles:  Injection technique: Single-shot  Needle Type: Echogenic Needle     Needle Length: 9cm      Additional Needles:   Procedures:,,,, ultrasound used (permanent image in chart),,,,  Narrative:  Start time: 07/02/2020 9:12 AM End time: 07/02/2020 9:19 AM Injection made incrementally with aspirations every 5 mL.  Performed by: Personally  Anesthesiologist: Eilene Ghazi, MD  Additional Notes: Patient tolerated the procedure well without complications

## 2020-07-02 NOTE — Evaluation (Signed)
Physical Therapy Evaluation Patient Details Name: George Monroe. MRN: [id number] DOB: 07-Jul-1951 Today's Date: 07/02/2020   History of Present Illness  Patient is 69 y.o. male s/p Lt TKA on 07/02/20 with PMH significant for OA, back pain, DM, asthma, depression, anxiety, HTN, HLD, Rt TKA in 2015.    Clinical Impression  Boluwatife Flight. is a 69 y.o. male POD 0 s/p Lt TKA. Patient reports independence with mobility at baseline. Patient is now limited by functional impairments (see PT problem list below) and requires min gaurd for transfers and gait with RW. Patient was able to ambulate ~120 feet with RW and min guard. Patient wife present and demonstrated safe guarding for gait with instruction from PT. Patient will benefit from continued skilled PT interventions to address impairments and progress towards PLOF. Acute PT will follow for additional session to review HEP and progress mobility and stair training in preparation for safe discharge home.     Follow Up Recommendations Follow surgeon's recommendation for DC plan and follow-up therapies    Equipment Recommendations  Rolling walker with 5" wheels;3in1 (PT)    Recommendations for Other Services       Precautions / Restrictions Precautions Precautions: Fall Restrictions Weight Bearing Restrictions: No Other Position/Activity Restrictions: WBAT      Mobility  Bed Mobility Overal bed mobility: Needs Assistance Bed Mobility: Supine to Sit;Sit to Supine     Supine to sit: Supervision Sit to supine: Min guard   General bed mobility comments: pt required extra time to sit up to EOB, no assist needed. Patient required cues to use Rt LE to assist Lt LE with mobility. reviewed car transfer using bed.   Transfers Overall transfer level: Needs assistance Equipment used: Rolling walker (2 wheeled) Transfers: Sit to/from Stand Sit to Stand: Min guard         General transfer comment: cues for safe hand placement/technique with  RW, no assist required for power up, pt steady in standing. cues to slide Lt foot forward when sitting.  Ambulation/Gait Ambulation/Gait assistance: Min guard;Min assist Gait Distance (Feet): 120 Feet Assistive device: Rolling walker (2 wheeled) Gait Pattern/deviations: Step-to pattern;Decreased stride length;Decreased weight shift to left;Decreased stance time - left Gait velocity: decr   General Gait Details: cues for safe step pattern and proximity to RW, no overt LOB noted. no buckling at Lt knee as pt able to reduce weight on Lt LE. (min assist initially at Lt knee to facilitate extension and pt progressed to min guard. Pt amb to bed from bathroom with guarding from wife and supervision from PT.   Stairs            Wheelchair Mobility    Modified Rankin (Stroke Patients Only)       Balance Overall balance assessment: Needs assistance Sitting-balance support: Feet supported Sitting balance-Leahy Scale: Good     Standing balance support: During functional activity;Bilateral upper extremity supported Standing balance-Leahy Scale: Fair              Pertinent Vitals/Pain Pain Assessment: Faces Faces Pain Scale: Hurts little more Pain Location: Lt knee Pain Descriptors / Indicators: Aching;Discomfort;Sore Pain Intervention(s): Limited activity within patient's tolerance;Repositioned;Premedicated before session;Monitored during session    Home Living Family/patient expects to be discharged to:: Private residence Living Arrangements: Spouse/significant other Available Help at Discharge: Family Type of Home: House Home Access: Stairs to enter Entrance Stairs-Rails: Doctor, general practice of Steps: 5 Home Layout: Two level;Bed/bath upstairs Home Equipment: None      Prior  Function Level of Independence: Independent         Comments: pt works full time as Surveyor, mining, he is independent with all mobility and ADL's.     Hand Dominance    Dominant Hand: Right    Extremity/Trunk Assessment   Upper Extremity Assessment Upper Extremity Assessment: Overall WFL for tasks assessed    Lower Extremity Assessment Lower Extremity Assessment: LLE deficits/detail LLE Deficits / Details: good quad activation, no extensor lag with SLR. LLE Sensation: WNL LLE Coordination: WNL    Cervical / Trunk Assessment Cervical / Trunk Assessment: Normal  Communication   Communication: No difficulties  Cognition Arousal/Alertness: Awake/alert Behavior During Therapy: WFL for tasks assessed/performed Overall Cognitive Status: Within Functional Limits for tasks assessed                   General Comments      Exercises     Assessment/Plan    PT Assessment Patient needs continued PT services  PT Problem List Decreased strength;Decreased range of motion;Decreased activity tolerance;Decreased balance;Decreased mobility;Decreased knowledge of use of DME;Decreased knowledge of precautions;Pain       PT Treatment Interventions DME instruction;Gait training;Stair training;Functional mobility training;Therapeutic activities;Therapeutic exercise;Balance training;Patient/family education    PT Goals (Current goals can be found in the Care Plan section)  Acute Rehab PT Goals Patient Stated Goal: get home and back to independence PT Goal Formulation: With patient Time For Goal Achievement: 07/09/20 Potential to Achieve Goals: Good    Frequency 7X/week   Barriers to discharge        AM-PAC PT "6 Clicks" Mobility  Outcome Measure Help needed turning from your back to your side while in a flat bed without using bedrails?: None Help needed moving from lying on your back to sitting on the side of a flat bed without using bedrails?: None Help needed moving to and from a bed to a chair (including a wheelchair)?: A Little Help needed standing up from a chair using your arms (e.g., wheelchair or bedside chair)?: A Little Help needed to  walk in hospital room?: A Little Help needed climbing 3-5 steps with a railing? : A Lot 6 Click Score: 19    End of Session Equipment Utilized During Treatment: Gait belt Activity Tolerance: Patient tolerated treatment well Patient left: in bed;with call bell/phone within reach;with family/visitor present Nurse Communication: Mobility status PT Visit Diagnosis: Muscle weakness (generalized) (M62.81);Difficulty in walking, not elsewhere classified (R26.2);Pain Pain - Right/Left: Left Pain - part of body: Knee    Time: 6333-5456 PT Time Calculation (min) (ACUTE ONLY): 38 min   Charges:   PT Evaluation $PT Eval Low Complexity: 1 Low PT Treatments $Gait Training: 8-22 mins $Therapeutic Activity: 8-22 mins      Wynn Maudlin, DPT Acute Rehabilitation Services  Office [phone] Pager [phone]  07/02/2020 5:26 PM

## 2020-07-02 NOTE — Progress Notes (Signed)
Assisted Dr. Rose with left, ultrasound guided, adductor canal block. Side rails up, monitors on throughout procedure. See vital signs in flow sheet. Tolerated Procedure well.  

## 2020-07-03 ENCOUNTER — Encounter (HOSPITAL_COMMUNITY): Payer: Self-pay | Admitting: Orthopedic Surgery

## 2021-04-06 IMAGING — DX DG KNEE 1-2V PORT*L*
2 series · 2 of 2 positions shown · non-contrast
Comparison: None.

CLINICAL DATA: Post knee replacement

EXAM:
PORTABLE LEFT KNEE - 1-2 VIEW

[knee ap]
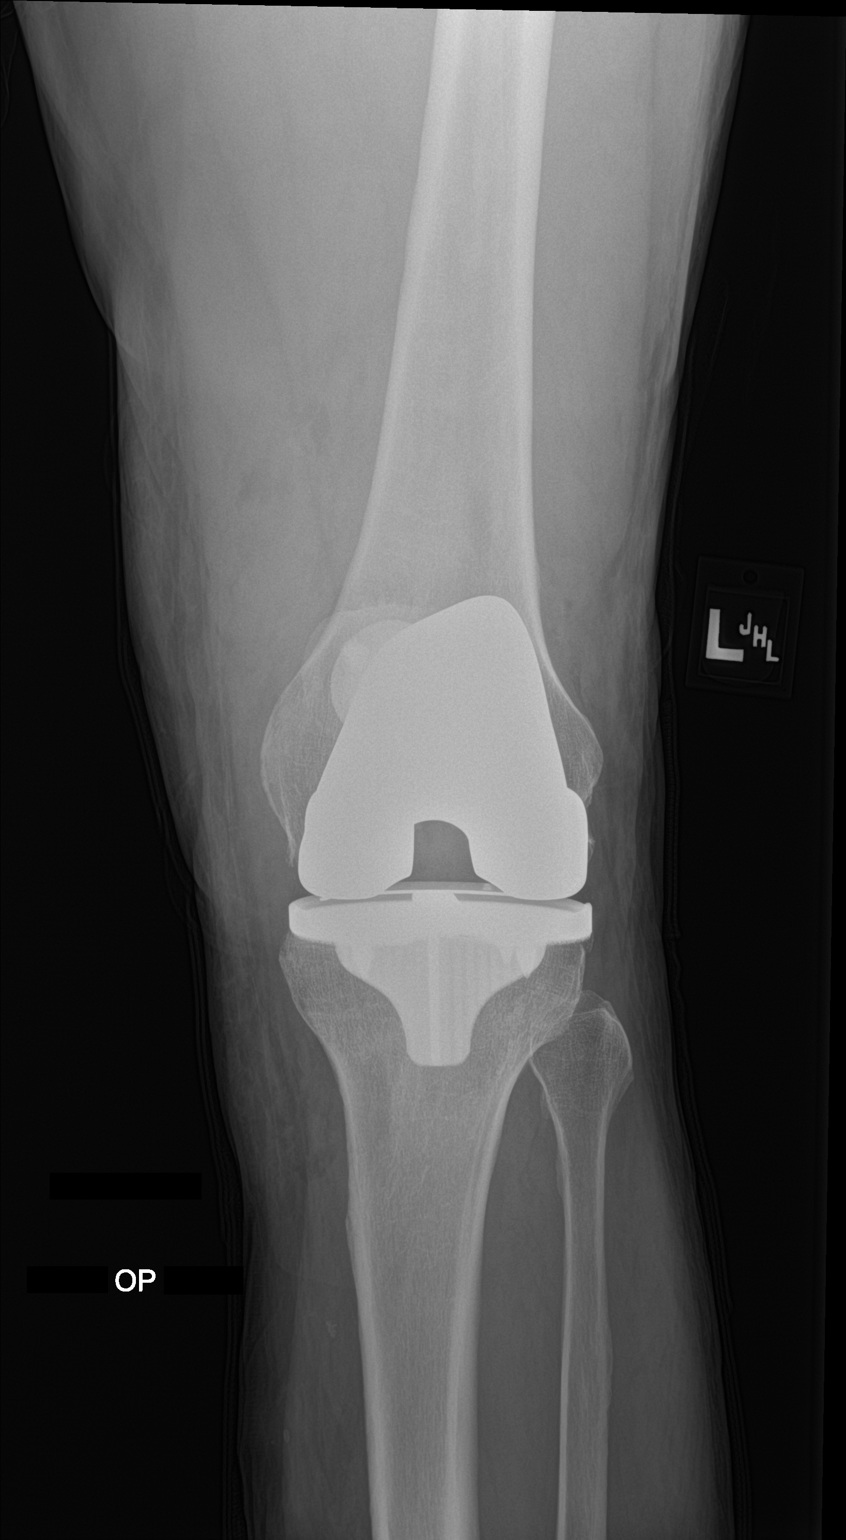

[knee lat]
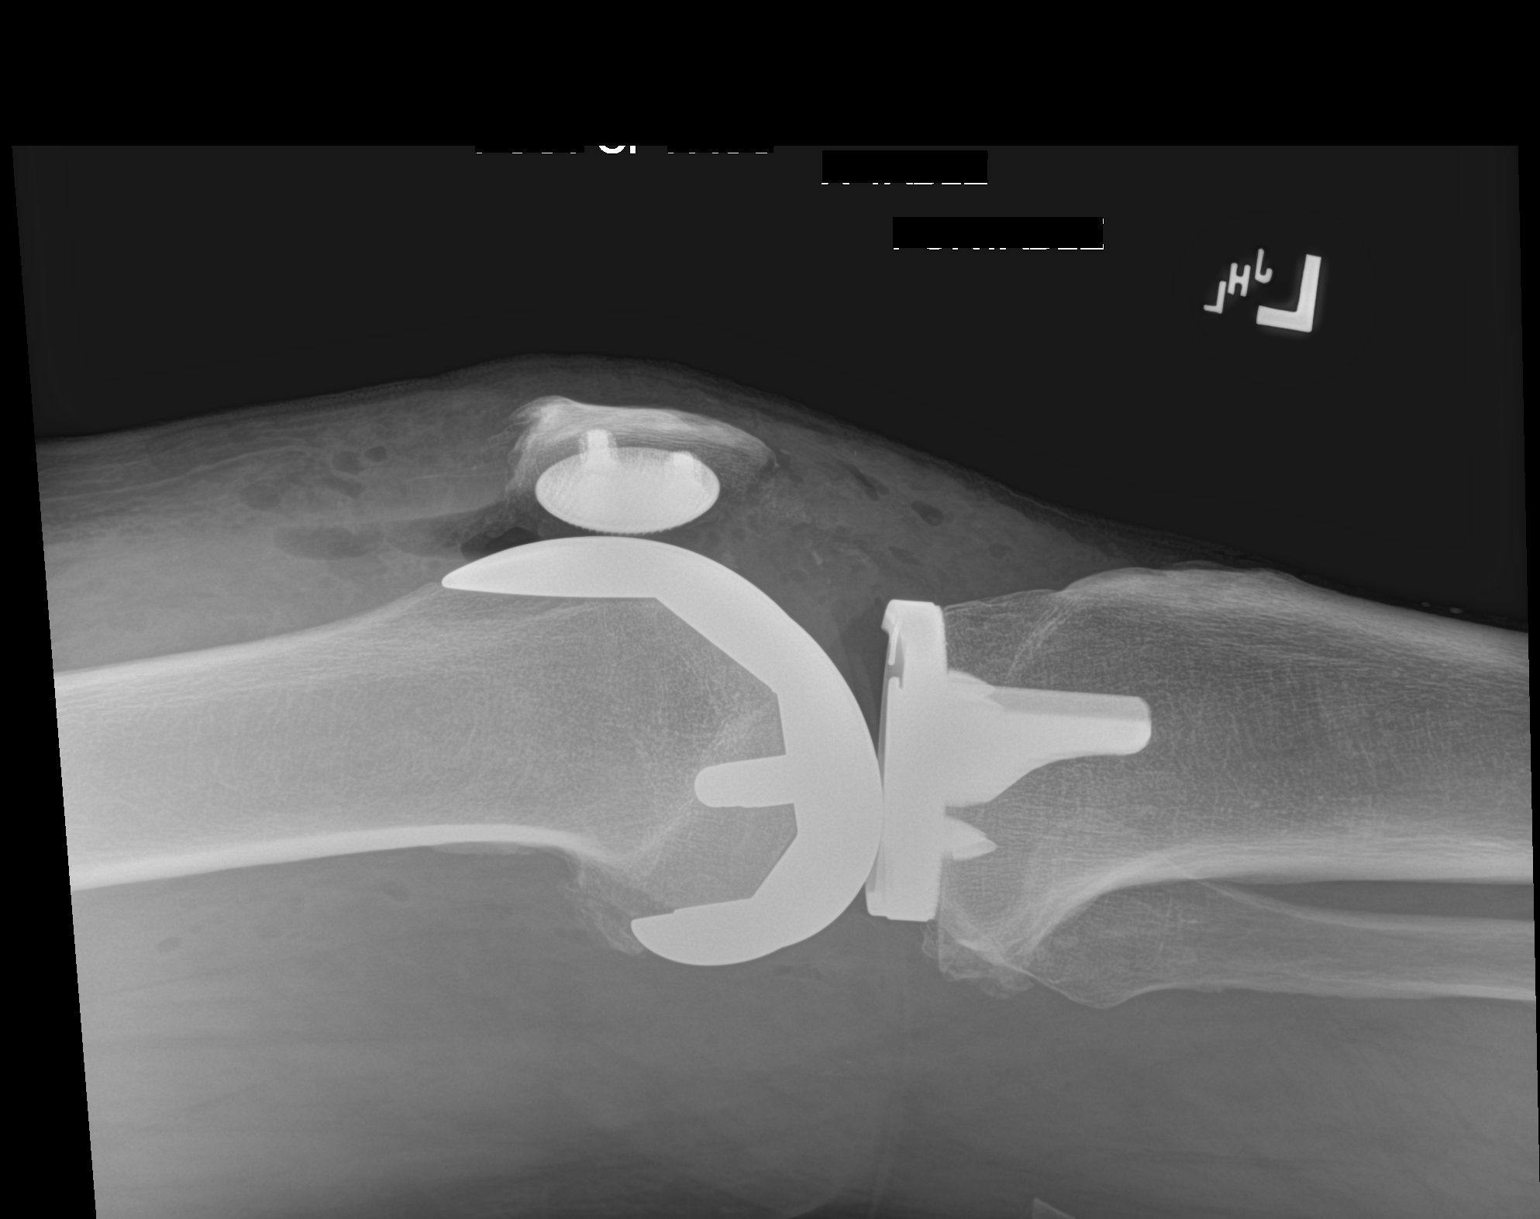

[2 of 2 positions shown; findings below may reference images not displayed]

FINDINGS: Postoperative changes left total knee arthroplasty. There is
postoperative soft tissue swelling and air. No evidence of
complication.
IMPRESSION: Standard operative appearance of left total knee arthroplasty.
